# Patient Record
Sex: Female | Born: 1950 | Race: Black or African American | Hispanic: No | Marital: Single | Smoking: Former smoker
Health system: Southern US, Community
[De-identification: ages and names within clinical notes are randomized; demographics above are authoritative.]

## PROBLEM LIST (undated history)

## (undated) DIAGNOSIS — Z5189 Encounter for other specified aftercare: Secondary | ICD-10-CM

## (undated) DIAGNOSIS — D649 Anemia, unspecified: Secondary | ICD-10-CM

## (undated) DIAGNOSIS — C801 Malignant (primary) neoplasm, unspecified: Secondary | ICD-10-CM

## (undated) DIAGNOSIS — M1990 Unspecified osteoarthritis, unspecified site: Secondary | ICD-10-CM

## (undated) DIAGNOSIS — Z8042 Family history of malignant neoplasm of prostate: Secondary | ICD-10-CM

## (undated) DIAGNOSIS — Z8041 Family history of malignant neoplasm of ovary: Secondary | ICD-10-CM

## (undated) DIAGNOSIS — D219 Benign neoplasm of connective and other soft tissue, unspecified: Secondary | ICD-10-CM

## (undated) DIAGNOSIS — E119 Type 2 diabetes mellitus without complications: Secondary | ICD-10-CM

## (undated) DIAGNOSIS — R87629 Unspecified abnormal cytological findings in specimens from vagina: Secondary | ICD-10-CM

## (undated) HISTORY — DX: Type 2 diabetes mellitus without complications: E11.9

## (undated) HISTORY — DX: Malignant (primary) neoplasm, unspecified: C80.1

## (undated) HISTORY — DX: Unspecified osteoarthritis, unspecified site: M19.90

## (undated) HISTORY — DX: Family history of malignant neoplasm of ovary: Z80.41

## (undated) HISTORY — PX: ABDOMINAL HYSTERECTOMY: SHX81

## (undated) HISTORY — DX: Benign neoplasm of connective and other soft tissue, unspecified: D21.9

## (undated) HISTORY — DX: Encounter for other specified aftercare: Z51.89

## (undated) HISTORY — DX: Unspecified abnormal cytological findings in specimens from vagina: R87.629

## (undated) HISTORY — DX: Anemia, unspecified: D64.9

## (undated) HISTORY — DX: Family history of malignant neoplasm of prostate: Z80.42

## (undated) HISTORY — PX: PARTIAL HYSTERECTOMY: SHX80

---

## 2006-12-09 ENCOUNTER — Encounter: Admission: RE | Admit: 2006-12-09 | Discharge: 2006-12-09 | Payer: Self-pay | Admitting: Internal Medicine

## 2007-03-23 ENCOUNTER — Ambulatory Visit (HOSPITAL_COMMUNITY): Admission: RE | Admit: 2007-03-23 | Discharge: 2007-03-23 | Payer: Self-pay | Admitting: Gastroenterology

## 2008-08-23 ENCOUNTER — Encounter: Admission: RE | Admit: 2008-08-23 | Discharge: 2008-08-23 | Payer: Self-pay | Admitting: Internal Medicine

## 2008-08-31 ENCOUNTER — Encounter: Admission: RE | Admit: 2008-08-31 | Discharge: 2008-08-31 | Payer: Self-pay | Admitting: Internal Medicine

## 2010-12-16 NOTE — Op Note (Signed)
NAMECHERESE, LOZANO              ACCOUNT NO.:  [id number]   MEDICAL RECORD NO.:  [id number]          PATIENT TYPE:  AMB   LOCATION:  ENDO                         FACILITY:  [facility]   PHYSICIAN:  Anselmo Rod, M.D.  DATE OF BIRTH:  01/13/1951   DATE OF PROCEDURE:  03/23/2007  DATE OF DISCHARGE:                               OPERATIVE REPORT   PROCEDURE:  Screening colonoscopy.   ENDOSCOPIST:  Anselmo Rod, M.D.   INSTRUMENT USED:  Pentax video colonoscope.   INDICATIONS FOR PROCEDURE:  60 year old African American female  undergoing a screening colonoscopy to rule out colonic polyps, masses,  etc.   PREPROCEDURE PREPARATION:  Informed consent was procured from the  patient.  The patient fasted for 8 hours prior to the procedure and  prepped with a bottle of magnesium citrate and a gallon of NuLytely the  night prior to the procedure.  The risks and benefits of the procedure,  including a 10% missed rate of cancer and polyp, were discussed with the  patient as well.   PREPROCEDURE PHYSICAL:  VITAL SIGNS:  Stable.  NECK:  Supple.  CHEST:  Clear.  HEART:  S1 and S2 regular.  ABDOMEN:  Soft with normal bowel sounds.   DESCRIPTION OF PROCEDURE:  The patient was placed in the left lateral  decubitus position and sedated with 70 mcg of Fentanyl and 6 mg of  Versed given intravenously in slow incremental doses. Once the patient  was adequately sedated and maintained on low-flow oxygen and continuous  cardiac monitoring, the Pentax video colonoscope was advanced from the  rectum to the cecum.  The appendiceal orifice and ileocecal valve were  clearly visualized and photographed.  No masses, polyps, erosions,  ulcerations, or diverticula were seen.  The terminal ileum appeared  healthy and without lesions.  Retroflexion revealed no abnormalities.  The patient tolerated the procedure well without immediate  complications.   IMPRESSION:  Normal colonoscopy to the terminal ileum.   No masses,  polyps, or diverticula noted.   RECOMMENDATIONS:  1. Continue a high fiber diet with liberal fluid intake.  2. Repeat colonoscopy in the next 10 years.  If the patient has any      abdominal symptoms in the interim, the patient is to contact the      office immediately for further recommendations.  3. Outpatient followup as need arises in the future.      Anselmo Rod, M.D.  Electronically Signed     JNM/MEDQ  D:  03/23/2007  T:  03/24/2007  Job:  [id number]   cc:   Gabriel Earing, M.D.  Fax: [phone]

## 2016-11-03 ENCOUNTER — Other Ambulatory Visit: Payer: Self-pay | Admitting: Family Medicine

## 2016-11-03 DIAGNOSIS — Z1231 Encounter for screening mammogram for malignant neoplasm of breast: Secondary | ICD-10-CM

## 2016-11-18 ENCOUNTER — Inpatient Hospital Stay: Admission: RE | Admit: 2016-11-18 | Payer: Self-pay | Source: Ambulatory Visit

## 2017-01-29 ENCOUNTER — Ambulatory Visit
Admission: RE | Admit: 2017-01-29 | Discharge: 2017-01-29 | Disposition: A | Discharge status: Home / Self Care | Payer: PRIVATE HEALTH INSURANCE | Source: Ambulatory Visit | Attending: Family Medicine | Admitting: Family Medicine

## 2017-01-29 ENCOUNTER — Encounter: Payer: Self-pay | Admitting: Radiology

## 2017-01-29 DIAGNOSIS — Z1231 Encounter for screening mammogram for malignant neoplasm of breast: Secondary | ICD-10-CM

## 2018-08-19 DIAGNOSIS — M542 Cervicalgia: Secondary | ICD-10-CM | POA: Diagnosis not present

## 2018-08-19 DIAGNOSIS — M6089 Other myositis, multiple sites: Secondary | ICD-10-CM | POA: Diagnosis not present

## 2018-08-19 DIAGNOSIS — M9902 Segmental and somatic dysfunction of thoracic region: Secondary | ICD-10-CM | POA: Diagnosis not present

## 2018-08-19 DIAGNOSIS — M545 Low back pain: Secondary | ICD-10-CM | POA: Diagnosis not present

## 2018-08-19 DIAGNOSIS — M9903 Segmental and somatic dysfunction of lumbar region: Secondary | ICD-10-CM | POA: Diagnosis not present

## 2018-08-19 DIAGNOSIS — M9901 Segmental and somatic dysfunction of cervical region: Secondary | ICD-10-CM | POA: Diagnosis not present

## 2018-08-26 DIAGNOSIS — M9901 Segmental and somatic dysfunction of cervical region: Secondary | ICD-10-CM | POA: Diagnosis not present

## 2018-08-26 DIAGNOSIS — M6089 Other myositis, multiple sites: Secondary | ICD-10-CM | POA: Diagnosis not present

## 2018-08-26 DIAGNOSIS — M542 Cervicalgia: Secondary | ICD-10-CM | POA: Diagnosis not present

## 2018-08-26 DIAGNOSIS — M545 Low back pain: Secondary | ICD-10-CM | POA: Diagnosis not present

## 2018-08-26 DIAGNOSIS — M9902 Segmental and somatic dysfunction of thoracic region: Secondary | ICD-10-CM | POA: Diagnosis not present

## 2018-08-26 DIAGNOSIS — M9903 Segmental and somatic dysfunction of lumbar region: Secondary | ICD-10-CM | POA: Diagnosis not present

## 2018-09-27 DIAGNOSIS — R0602 Shortness of breath: Secondary | ICD-10-CM | POA: Diagnosis not present

## 2018-09-27 DIAGNOSIS — E789 Disorder of lipoprotein metabolism, unspecified: Secondary | ICD-10-CM | POA: Diagnosis not present

## 2018-09-27 DIAGNOSIS — R5383 Other fatigue: Secondary | ICD-10-CM | POA: Diagnosis not present

## 2018-09-27 DIAGNOSIS — E559 Vitamin D deficiency, unspecified: Secondary | ICD-10-CM | POA: Diagnosis not present

## 2018-09-27 DIAGNOSIS — E119 Type 2 diabetes mellitus without complications: Secondary | ICD-10-CM | POA: Diagnosis not present

## 2018-10-11 DIAGNOSIS — E1165 Type 2 diabetes mellitus with hyperglycemia: Secondary | ICD-10-CM | POA: Diagnosis not present

## 2018-10-11 DIAGNOSIS — M2012 Hallux valgus (acquired), left foot: Secondary | ICD-10-CM | POA: Diagnosis not present

## 2018-10-11 DIAGNOSIS — M2011 Hallux valgus (acquired), right foot: Secondary | ICD-10-CM | POA: Diagnosis not present

## 2018-10-13 DIAGNOSIS — E119 Type 2 diabetes mellitus without complications: Secondary | ICD-10-CM | POA: Diagnosis not present

## 2019-01-24 ENCOUNTER — Other Ambulatory Visit: Payer: Self-pay | Admitting: *Deleted

## 2019-01-24 DIAGNOSIS — Z20822 Contact with and (suspected) exposure to covid-19: Secondary | ICD-10-CM

## 2019-01-27 LAB — NOVEL CORONAVIRUS, NAA: SARS-CoV-2, NAA: NOT DETECTED

## 2019-01-31 ENCOUNTER — Telehealth: Payer: Self-pay | Admitting: Family Medicine

## 2019-01-31 NOTE — Telephone Encounter (Signed)
Pt called and gave her Neg Covid results, pt expressed understanding

## 2019-03-28 DIAGNOSIS — R5383 Other fatigue: Secondary | ICD-10-CM | POA: Diagnosis not present

## 2019-03-28 DIAGNOSIS — Z0000 Encounter for general adult medical examination without abnormal findings: Secondary | ICD-10-CM | POA: Diagnosis not present

## 2019-03-28 DIAGNOSIS — Z1159 Encounter for screening for other viral diseases: Secondary | ICD-10-CM | POA: Diagnosis not present

## 2019-03-28 DIAGNOSIS — E119 Type 2 diabetes mellitus without complications: Secondary | ICD-10-CM | POA: Diagnosis not present

## 2019-03-28 DIAGNOSIS — E559 Vitamin D deficiency, unspecified: Secondary | ICD-10-CM | POA: Diagnosis not present

## 2019-03-28 DIAGNOSIS — E789 Disorder of lipoprotein metabolism, unspecified: Secondary | ICD-10-CM | POA: Diagnosis not present

## 2019-03-28 DIAGNOSIS — R0602 Shortness of breath: Secondary | ICD-10-CM | POA: Diagnosis not present

## 2019-03-30 ENCOUNTER — Other Ambulatory Visit: Payer: Self-pay | Admitting: Family Medicine

## 2019-03-30 DIAGNOSIS — Z1231 Encounter for screening mammogram for malignant neoplasm of breast: Secondary | ICD-10-CM

## 2019-04-04 DIAGNOSIS — E559 Vitamin D deficiency, unspecified: Secondary | ICD-10-CM | POA: Diagnosis not present

## 2019-04-04 DIAGNOSIS — E119 Type 2 diabetes mellitus without complications: Secondary | ICD-10-CM | POA: Diagnosis not present

## 2019-04-04 DIAGNOSIS — R5383 Other fatigue: Secondary | ICD-10-CM | POA: Diagnosis not present

## 2019-04-05 DIAGNOSIS — R0602 Shortness of breath: Secondary | ICD-10-CM | POA: Diagnosis not present

## 2019-04-05 DIAGNOSIS — I493 Ventricular premature depolarization: Secondary | ICD-10-CM | POA: Diagnosis not present

## 2019-04-06 DIAGNOSIS — Z780 Asymptomatic menopausal state: Secondary | ICD-10-CM | POA: Diagnosis not present

## 2019-04-13 DIAGNOSIS — E119 Type 2 diabetes mellitus without complications: Secondary | ICD-10-CM | POA: Diagnosis not present

## 2019-04-13 DIAGNOSIS — Z780 Asymptomatic menopausal state: Secondary | ICD-10-CM | POA: Diagnosis not present

## 2019-04-13 DIAGNOSIS — Z1339 Encounter for screening examination for other mental health and behavioral disorders: Secondary | ICD-10-CM | POA: Diagnosis not present

## 2019-04-13 DIAGNOSIS — E559 Vitamin D deficiency, unspecified: Secondary | ICD-10-CM | POA: Diagnosis not present

## 2019-04-13 DIAGNOSIS — E789 Disorder of lipoprotein metabolism, unspecified: Secondary | ICD-10-CM | POA: Diagnosis not present

## 2019-04-13 DIAGNOSIS — Z1331 Encounter for screening for depression: Secondary | ICD-10-CM | POA: Diagnosis not present

## 2019-05-25 ENCOUNTER — Other Ambulatory Visit: Payer: Self-pay

## 2019-05-25 ENCOUNTER — Ambulatory Visit
Admission: RE | Admit: 2019-05-25 | Discharge: 2019-05-25 | Disposition: A | Discharge status: Home / Self Care | Payer: PPO | Source: Ambulatory Visit | Attending: Family Medicine | Admitting: Family Medicine

## 2019-05-25 DIAGNOSIS — Z1231 Encounter for screening mammogram for malignant neoplasm of breast: Secondary | ICD-10-CM

## 2019-06-07 DIAGNOSIS — Z20828 Contact with and (suspected) exposure to other viral communicable diseases: Secondary | ICD-10-CM | POA: Diagnosis not present

## 2019-06-28 DIAGNOSIS — E119 Type 2 diabetes mellitus without complications: Secondary | ICD-10-CM | POA: Diagnosis not present

## 2019-06-28 DIAGNOSIS — H40033 Anatomical narrow angle, bilateral: Secondary | ICD-10-CM | POA: Diagnosis not present

## 2019-07-18 DIAGNOSIS — I493 Ventricular premature depolarization: Secondary | ICD-10-CM | POA: Diagnosis not present

## 2019-07-18 DIAGNOSIS — E559 Vitamin D deficiency, unspecified: Secondary | ICD-10-CM | POA: Diagnosis not present

## 2019-07-18 DIAGNOSIS — E119 Type 2 diabetes mellitus without complications: Secondary | ICD-10-CM | POA: Diagnosis not present

## 2019-07-18 DIAGNOSIS — Z1159 Encounter for screening for other viral diseases: Secondary | ICD-10-CM | POA: Diagnosis not present

## 2019-07-18 DIAGNOSIS — E789 Disorder of lipoprotein metabolism, unspecified: Secondary | ICD-10-CM | POA: Diagnosis not present

## 2019-07-18 DIAGNOSIS — Z03818 Encounter for observation for suspected exposure to other biological agents ruled out: Secondary | ICD-10-CM | POA: Diagnosis not present

## 2019-09-29 ENCOUNTER — Ambulatory Visit: Payer: PPO | Attending: Internal Medicine

## 2019-09-29 DIAGNOSIS — Z23 Encounter for immunization: Secondary | ICD-10-CM

## 2019-09-29 NOTE — Progress Notes (Signed)
   Covid-19 Vaccination Clinic  Name:  Nyasha Woolf    MRN: [id number] DOB: 05-09-1951  09/29/2019  Ms. Ochoa was observed post Covid-19 immunization for 15 minutes without incidence. She was provided with Vaccine Information Sheet and instruction to access the V-Safe system.   Ms. Zoda was instructed to call 911 with any severe reactions post vaccine: Marland Kitchen Difficulty breathing  . Swelling of your face and throat  . A fast heartbeat  . A bad rash all over your body  . Dizziness and weakness    Immunizations Administered    Name Date Dose VIS Date Route   Pfizer COVID-19 Vaccine 09/29/2019  2:21 PM 0.3 mL 07/14/2019 Intramuscular   Manufacturer: [place]   Lot: [id number]   [place]: [id number]

## 2019-10-25 ENCOUNTER — Ambulatory Visit: Payer: PPO | Attending: Internal Medicine

## 2019-10-25 DIAGNOSIS — Z23 Encounter for immunization: Secondary | ICD-10-CM

## 2019-10-25 NOTE — Progress Notes (Signed)
   Covid-19 Vaccination Clinic  Name:  Christy Wiley    MRN: [id number] DOB: 11-04-1950  10/25/2019  Christy Wiley was observed post Covid-19 immunization for 15 minutes without incident. She was provided with Vaccine Information Sheet and instruction to access the V-Safe system.   Christy Wiley was instructed to call 911 with any severe reactions post vaccine: Marland Kitchen Difficulty breathing  . Swelling of face and throat  . A fast heartbeat  . A bad rash all over body  . Dizziness and weakness   Immunizations Administered    Name Date Dose VIS Date Route   Pfizer COVID-19 Vaccine 10/25/2019  3:08 PM 0.3 mL 07/14/2019 Intramuscular   Manufacturer: [place]   Lot: [id number]   [place]: [id number]

## 2019-12-28 DIAGNOSIS — E789 Disorder of lipoprotein metabolism, unspecified: Secondary | ICD-10-CM | POA: Diagnosis not present

## 2019-12-28 DIAGNOSIS — E559 Vitamin D deficiency, unspecified: Secondary | ICD-10-CM | POA: Diagnosis not present

## 2019-12-28 DIAGNOSIS — Z1159 Encounter for screening for other viral diseases: Secondary | ICD-10-CM | POA: Diagnosis not present

## 2019-12-28 DIAGNOSIS — E119 Type 2 diabetes mellitus without complications: Secondary | ICD-10-CM | POA: Diagnosis not present

## 2020-04-04 DIAGNOSIS — E119 Type 2 diabetes mellitus without complications: Secondary | ICD-10-CM | POA: Diagnosis not present

## 2020-04-04 DIAGNOSIS — Z1331 Encounter for screening for depression: Secondary | ICD-10-CM | POA: Diagnosis not present

## 2020-04-04 DIAGNOSIS — Z1159 Encounter for screening for other viral diseases: Secondary | ICD-10-CM | POA: Diagnosis not present

## 2020-04-04 DIAGNOSIS — E1165 Type 2 diabetes mellitus with hyperglycemia: Secondary | ICD-10-CM | POA: Diagnosis not present

## 2020-04-04 DIAGNOSIS — E559 Vitamin D deficiency, unspecified: Secondary | ICD-10-CM | POA: Diagnosis not present

## 2020-04-04 DIAGNOSIS — E789 Disorder of lipoprotein metabolism, unspecified: Secondary | ICD-10-CM | POA: Diagnosis not present

## 2020-04-04 DIAGNOSIS — R0602 Shortness of breath: Secondary | ICD-10-CM | POA: Diagnosis not present

## 2020-04-04 DIAGNOSIS — Z0000 Encounter for general adult medical examination without abnormal findings: Secondary | ICD-10-CM | POA: Diagnosis not present

## 2020-04-04 DIAGNOSIS — Z20822 Contact with and (suspected) exposure to covid-19: Secondary | ICD-10-CM | POA: Diagnosis not present

## 2020-04-04 DIAGNOSIS — Z1339 Encounter for screening examination for other mental health and behavioral disorders: Secondary | ICD-10-CM | POA: Diagnosis not present

## 2020-04-04 DIAGNOSIS — R5383 Other fatigue: Secondary | ICD-10-CM | POA: Diagnosis not present

## 2020-04-12 DIAGNOSIS — R5383 Other fatigue: Secondary | ICD-10-CM | POA: Diagnosis not present

## 2020-04-12 DIAGNOSIS — M2012 Hallux valgus (acquired), left foot: Secondary | ICD-10-CM | POA: Diagnosis not present

## 2020-04-12 DIAGNOSIS — Z87891 Personal history of nicotine dependence: Secondary | ICD-10-CM | POA: Diagnosis not present

## 2020-04-12 DIAGNOSIS — M2011 Hallux valgus (acquired), right foot: Secondary | ICD-10-CM | POA: Diagnosis not present

## 2020-04-12 DIAGNOSIS — Z1159 Encounter for screening for other viral diseases: Secondary | ICD-10-CM | POA: Diagnosis not present

## 2020-04-12 DIAGNOSIS — E1165 Type 2 diabetes mellitus with hyperglycemia: Secondary | ICD-10-CM | POA: Diagnosis not present

## 2020-04-12 DIAGNOSIS — E559 Vitamin D deficiency, unspecified: Secondary | ICD-10-CM | POA: Diagnosis not present

## 2020-04-16 DIAGNOSIS — E789 Disorder of lipoprotein metabolism, unspecified: Secondary | ICD-10-CM | POA: Diagnosis not present

## 2020-04-16 DIAGNOSIS — E559 Vitamin D deficiency, unspecified: Secondary | ICD-10-CM | POA: Diagnosis not present

## 2020-04-16 DIAGNOSIS — Z1211 Encounter for screening for malignant neoplasm of colon: Secondary | ICD-10-CM | POA: Diagnosis not present

## 2020-04-16 DIAGNOSIS — E119 Type 2 diabetes mellitus without complications: Secondary | ICD-10-CM | POA: Diagnosis not present

## 2020-04-18 DIAGNOSIS — L821 Other seborrheic keratosis: Secondary | ICD-10-CM | POA: Diagnosis not present

## 2020-04-18 DIAGNOSIS — D2371 Other benign neoplasm of skin of right lower limb, including hip: Secondary | ICD-10-CM | POA: Diagnosis not present

## 2020-04-18 DIAGNOSIS — L853 Xerosis cutis: Secondary | ICD-10-CM | POA: Diagnosis not present

## 2020-04-24 DIAGNOSIS — Z1211 Encounter for screening for malignant neoplasm of colon: Secondary | ICD-10-CM | POA: Diagnosis not present

## 2020-04-24 DIAGNOSIS — Z01818 Encounter for other preprocedural examination: Secondary | ICD-10-CM | POA: Diagnosis not present

## 2020-06-06 DIAGNOSIS — E119 Type 2 diabetes mellitus without complications: Secondary | ICD-10-CM | POA: Diagnosis not present

## 2020-06-06 DIAGNOSIS — H40033 Anatomical narrow angle, bilateral: Secondary | ICD-10-CM | POA: Diagnosis not present

## 2020-06-12 DIAGNOSIS — Z1211 Encounter for screening for malignant neoplasm of colon: Secondary | ICD-10-CM | POA: Diagnosis not present

## 2020-06-13 DIAGNOSIS — E119 Type 2 diabetes mellitus without complications: Secondary | ICD-10-CM | POA: Diagnosis not present

## 2020-06-13 DIAGNOSIS — Z01818 Encounter for other preprocedural examination: Secondary | ICD-10-CM | POA: Diagnosis not present

## 2020-06-13 DIAGNOSIS — Z1211 Encounter for screening for malignant neoplasm of colon: Secondary | ICD-10-CM | POA: Diagnosis not present

## 2020-06-18 DIAGNOSIS — K635 Polyp of colon: Secondary | ICD-10-CM | POA: Diagnosis not present

## 2020-07-03 DIAGNOSIS — K648 Other hemorrhoids: Secondary | ICD-10-CM | POA: Diagnosis not present

## 2020-07-03 DIAGNOSIS — D369 Benign neoplasm, unspecified site: Secondary | ICD-10-CM | POA: Diagnosis not present

## 2020-07-22 DIAGNOSIS — M7711 Lateral epicondylitis, right elbow: Secondary | ICD-10-CM | POA: Diagnosis not present

## 2020-07-22 DIAGNOSIS — M25521 Pain in right elbow: Secondary | ICD-10-CM | POA: Diagnosis not present

## 2020-08-01 DIAGNOSIS — Z20822 Contact with and (suspected) exposure to covid-19: Secondary | ICD-10-CM | POA: Diagnosis not present

## 2020-08-01 DIAGNOSIS — N183 Chronic kidney disease, stage 3 unspecified: Secondary | ICD-10-CM | POA: Diagnosis not present

## 2020-08-01 DIAGNOSIS — E789 Disorder of lipoprotein metabolism, unspecified: Secondary | ICD-10-CM | POA: Diagnosis not present

## 2020-08-01 DIAGNOSIS — E559 Vitamin D deficiency, unspecified: Secondary | ICD-10-CM | POA: Diagnosis not present

## 2020-08-01 DIAGNOSIS — M7711 Lateral epicondylitis, right elbow: Secondary | ICD-10-CM | POA: Diagnosis not present

## 2020-08-01 DIAGNOSIS — E119 Type 2 diabetes mellitus without complications: Secondary | ICD-10-CM | POA: Diagnosis not present

## 2020-08-01 DIAGNOSIS — M25521 Pain in right elbow: Secondary | ICD-10-CM | POA: Diagnosis not present

## 2021-08-21 IMAGING — MG DIGITAL SCREENING BILAT W/ TOMO W/ CAD
8 series · 9 of 24 positions shown · non-contrast
Comparison: Previous exam(s).

CLINICAL DATA: Screening.

EXAM:
DIGITAL SCREENING BILATERAL MAMMOGRAM WITH TOMO AND CAD

[R CC synth-2D]
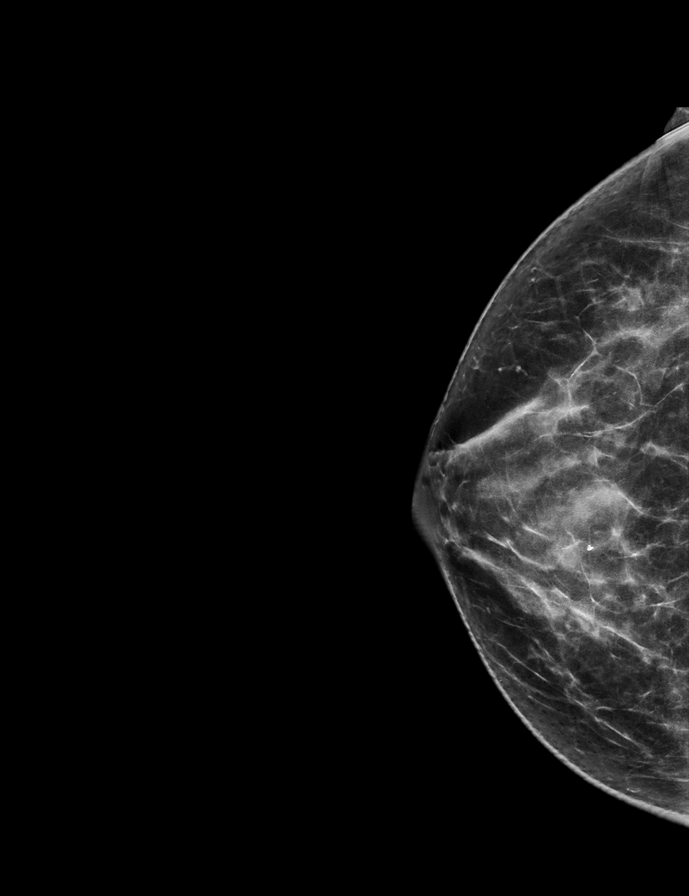

[L CC synth-2D]
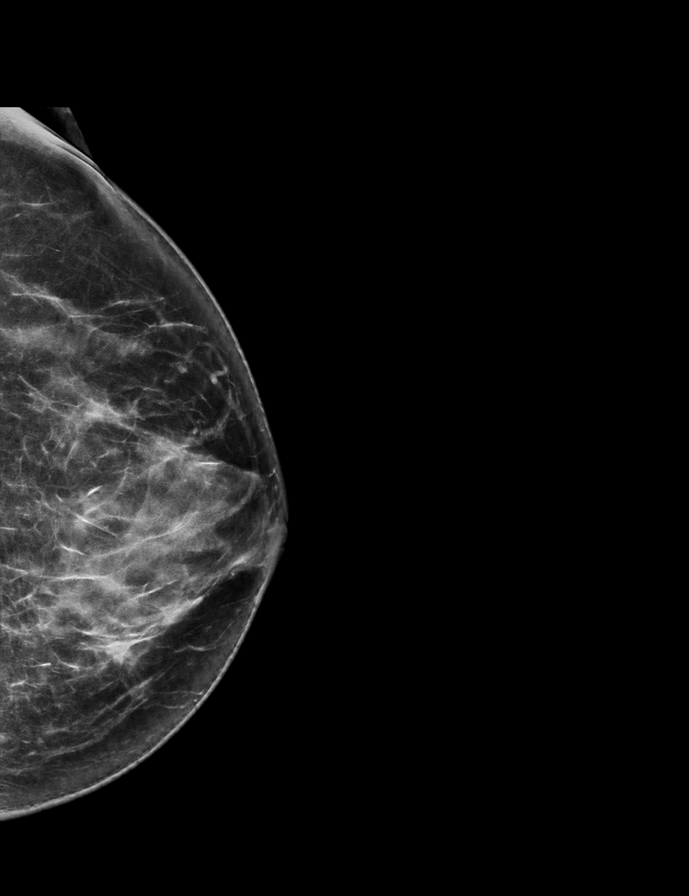

[R MLO synth-2D]
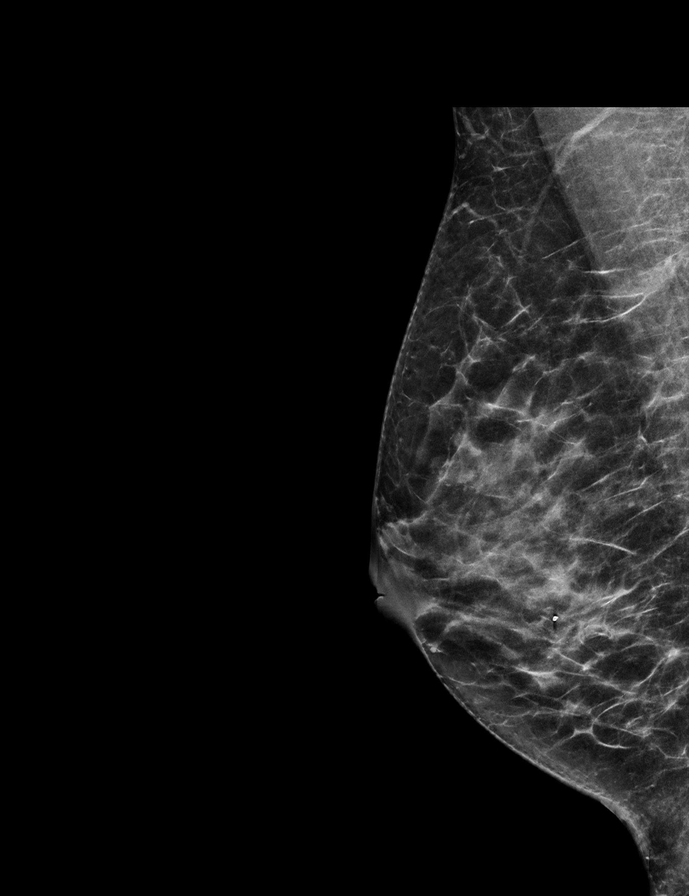

[L MLO synth-2D]
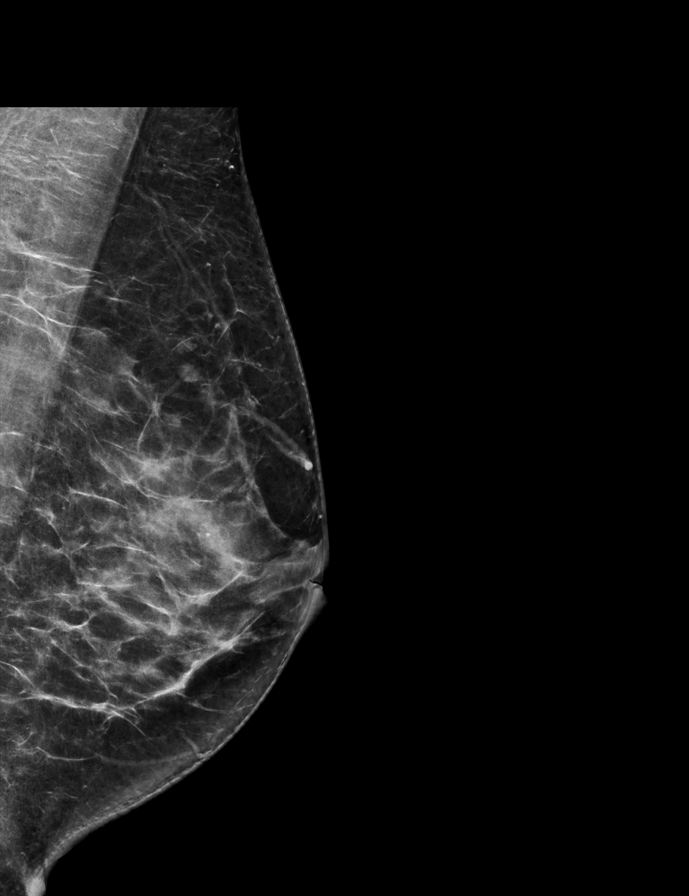

[L CC tomo · 2 of 70 frames shown]
[frame 23/70]
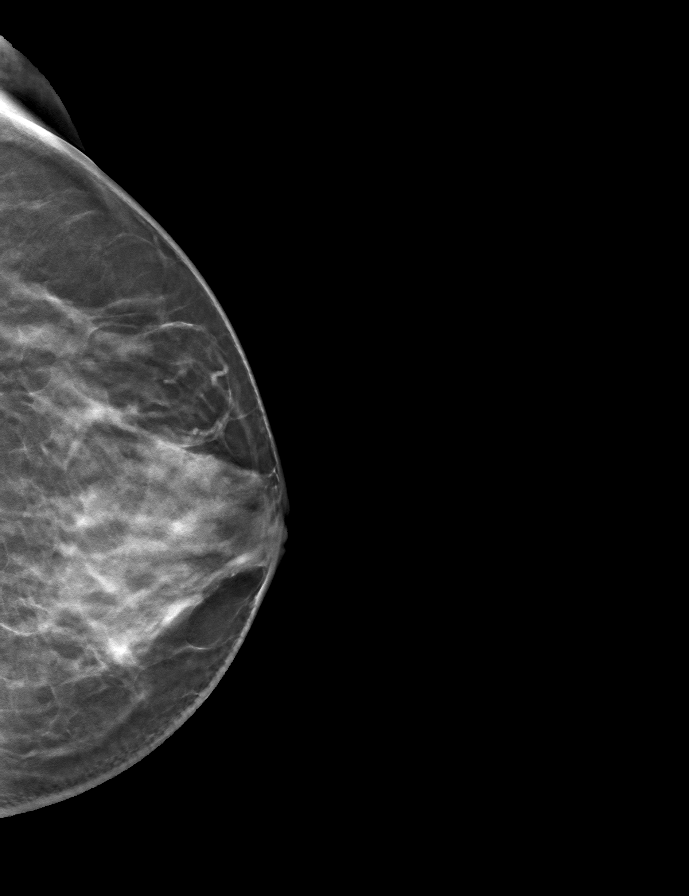
[frame 35/70]
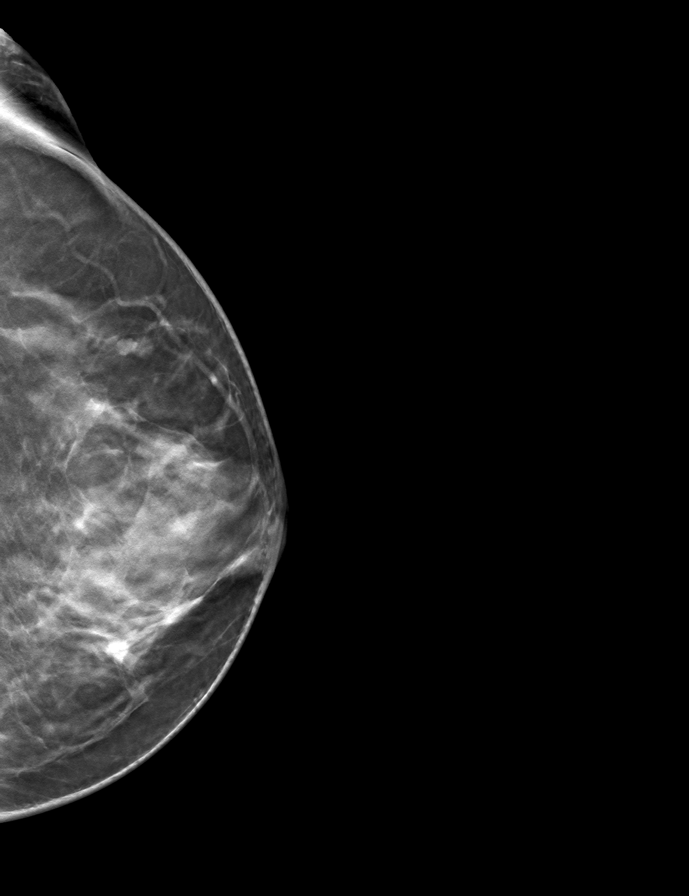

[R MLO tomo · tomo slice 31/62.0]
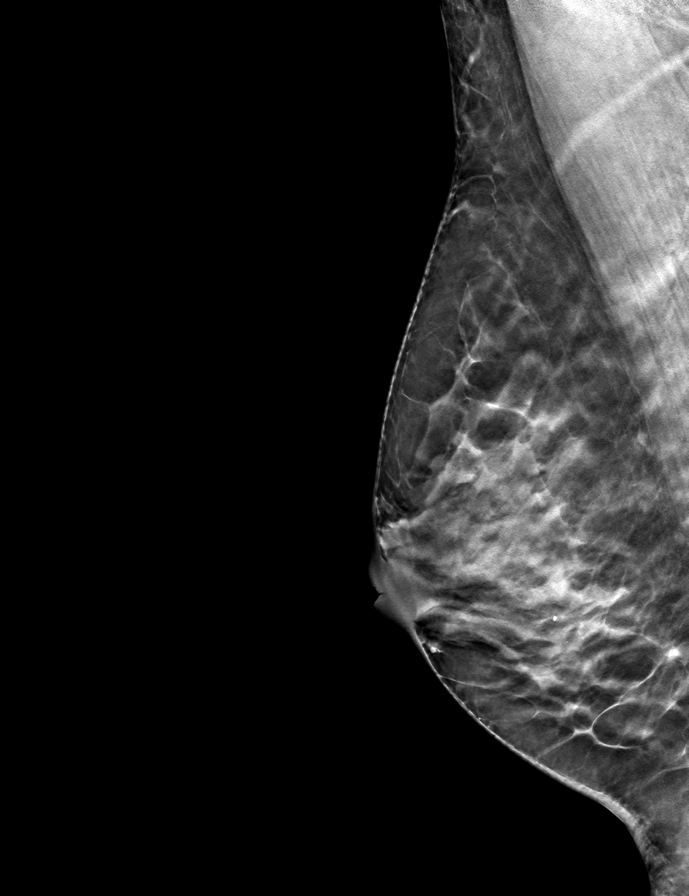

[R CC tomo · tomo slice 33/64.0]
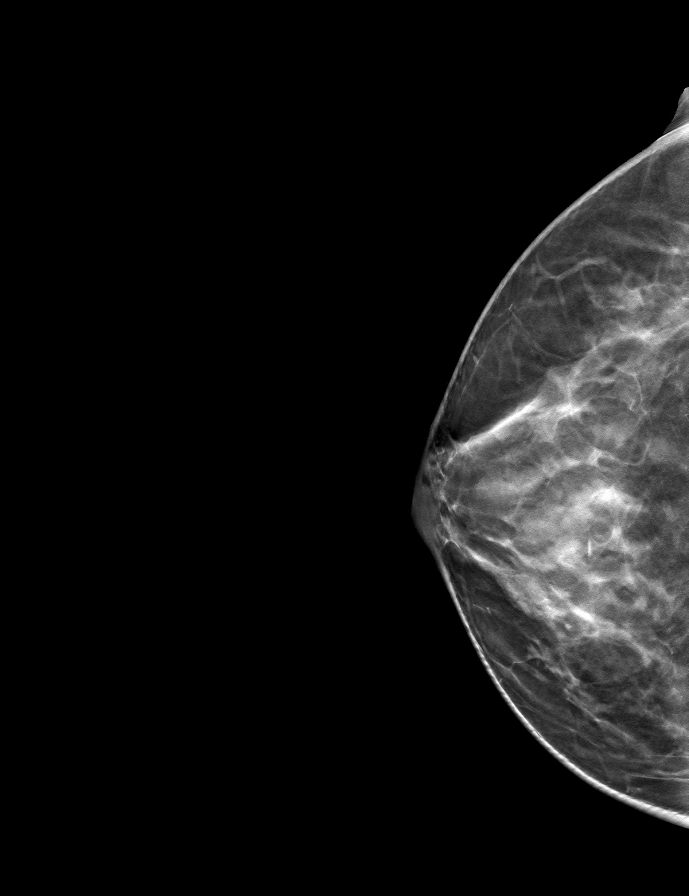

[L MLO tomo · tomo slice 35/70.0]
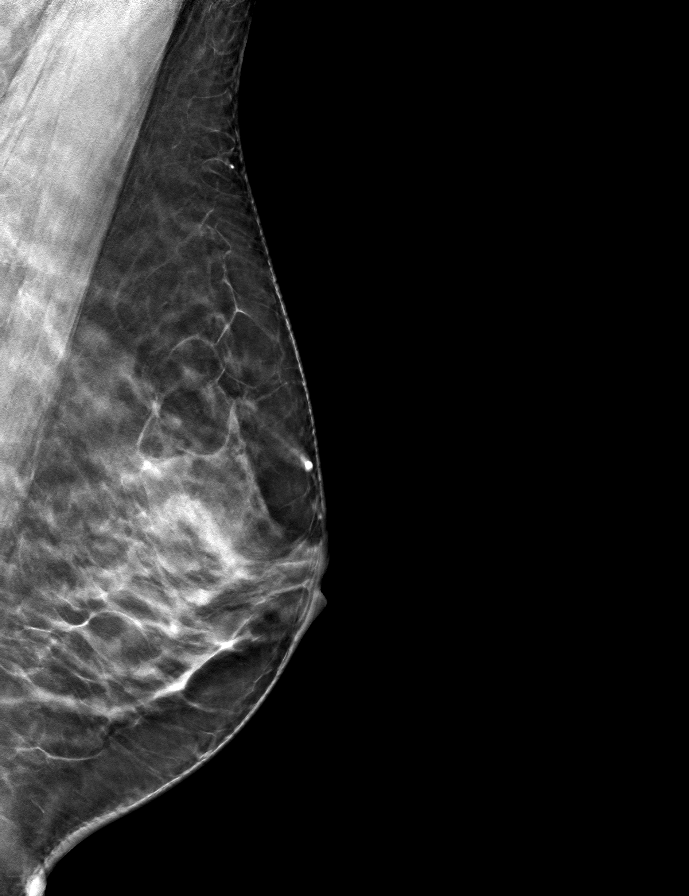

[9 of 24 positions shown; findings below may reference images not displayed]

ACR Breast Density Category c: The breast tissue is heterogeneously
dense, which may obscure small masses.
FINDINGS: There are no findings suspicious for malignancy. Images were
processed with CAD.
IMPRESSION: No mammographic evidence of malignancy. A result letter of this
screening mammogram will be mailed directly to the patient.

RECOMMENDATION:
Screening mammogram in one year. (Code:[id number])

BI-RADS CATEGORY  1: Negative.

## 2021-09-30 ENCOUNTER — Other Ambulatory Visit: Payer: Self-pay | Admitting: Family Medicine

## 2021-09-30 DIAGNOSIS — Z1231 Encounter for screening mammogram for malignant neoplasm of breast: Secondary | ICD-10-CM

## 2022-10-08 ENCOUNTER — Other Ambulatory Visit: Payer: Self-pay | Admitting: Family Medicine

## 2022-10-08 DIAGNOSIS — Z1231 Encounter for screening mammogram for malignant neoplasm of breast: Secondary | ICD-10-CM

## 2022-11-27 ENCOUNTER — Ambulatory Visit
Admission: RE | Admit: 2022-11-27 | Discharge: 2022-11-27 | Disposition: A | Discharge status: Home / Self Care | Payer: PPO | Source: Ambulatory Visit | Attending: Family Medicine | Admitting: Family Medicine

## 2022-11-27 DIAGNOSIS — Z1231 Encounter for screening mammogram for malignant neoplasm of breast: Secondary | ICD-10-CM

## 2024-02-17 ENCOUNTER — Other Ambulatory Visit: Payer: Self-pay | Admitting: Family Medicine

## 2024-02-17 DIAGNOSIS — Z1231 Encounter for screening mammogram for malignant neoplasm of breast: Secondary | ICD-10-CM

## 2024-02-18 ENCOUNTER — Ambulatory Visit
Admission: RE | Admit: 2024-02-18 | Discharge: 2024-02-18 | Disposition: A | Discharge status: Home / Self Care | Source: Ambulatory Visit | Attending: Family Medicine | Admitting: Family Medicine

## 2024-02-18 DIAGNOSIS — Z1231 Encounter for screening mammogram for malignant neoplasm of breast: Secondary | ICD-10-CM

## 2024-02-24 ENCOUNTER — Other Ambulatory Visit: Payer: Self-pay | Admitting: Family Medicine

## 2024-02-24 DIAGNOSIS — R928 Other abnormal and inconclusive findings on diagnostic imaging of breast: Secondary | ICD-10-CM

## 2024-03-07 ENCOUNTER — Other Ambulatory Visit: Payer: Self-pay | Admitting: Family Medicine

## 2024-03-07 ENCOUNTER — Ambulatory Visit
Admission: RE | Admit: 2024-03-07 | Discharge: 2024-03-07 | Disposition: A | Discharge status: Home / Self Care | Source: Ambulatory Visit | Attending: Family Medicine | Admitting: Family Medicine

## 2024-03-07 DIAGNOSIS — R599 Enlarged lymph nodes, unspecified: Secondary | ICD-10-CM

## 2024-03-07 DIAGNOSIS — R928 Other abnormal and inconclusive findings on diagnostic imaging of breast: Secondary | ICD-10-CM

## 2024-03-07 DIAGNOSIS — N6310 Unspecified lump in the right breast, unspecified quadrant: Secondary | ICD-10-CM

## 2024-03-28 ENCOUNTER — Ambulatory Visit
Admission: RE | Admit: 2024-03-28 | Discharge: 2024-03-28 | Disposition: A | Discharge status: Home / Self Care | Source: Ambulatory Visit | Attending: Family Medicine | Admitting: Family Medicine

## 2024-03-28 DIAGNOSIS — R599 Enlarged lymph nodes, unspecified: Secondary | ICD-10-CM

## 2024-03-28 DIAGNOSIS — N6310 Unspecified lump in the right breast, unspecified quadrant: Secondary | ICD-10-CM

## 2024-03-28 HISTORY — PX: BREAST BIOPSY: SHX20

## 2024-03-29 LAB — SURGICAL PATHOLOGY

## 2024-04-04 ENCOUNTER — Encounter: Payer: Self-pay | Admitting: *Deleted

## 2024-04-04 DIAGNOSIS — C50811 Malignant neoplasm of overlapping sites of right female breast: Secondary | ICD-10-CM | POA: Insufficient documentation

## 2024-04-05 ENCOUNTER — Other Ambulatory Visit: Payer: Self-pay

## 2024-04-05 ENCOUNTER — Encounter: Payer: Self-pay | Admitting: General Practice

## 2024-04-05 ENCOUNTER — Ambulatory Visit: Attending: General Surgery | Admitting: Physical Therapy

## 2024-04-05 ENCOUNTER — Encounter: Payer: Self-pay | Admitting: *Deleted

## 2024-04-05 ENCOUNTER — Encounter: Payer: Self-pay | Admitting: Physical Therapy

## 2024-04-05 ENCOUNTER — Ambulatory Visit: Payer: Self-pay | Admitting: General Surgery

## 2024-04-05 ENCOUNTER — Inpatient Hospital Stay: Attending: Hematology and Oncology

## 2024-04-05 ENCOUNTER — Inpatient Hospital Stay: Admitting: Genetic Counselor

## 2024-04-05 ENCOUNTER — Ambulatory Visit
Admission: RE | Admit: 2024-04-05 | Discharge: 2024-04-05 | Disposition: A | Discharge status: Home / Self Care | Source: Ambulatory Visit | Attending: Radiation Oncology | Admitting: Radiation Oncology

## 2024-04-05 ENCOUNTER — Inpatient Hospital Stay: Admitting: Hematology and Oncology

## 2024-04-05 VITALS — BP 145/70 | HR 67 | Temp 97.3°F | Resp 16 | Wt 145.5 lb

## 2024-04-05 DIAGNOSIS — Z171 Estrogen receptor negative status [ER-]: Secondary | ICD-10-CM | POA: Insufficient documentation

## 2024-04-05 DIAGNOSIS — Z5111 Encounter for antineoplastic chemotherapy: Secondary | ICD-10-CM | POA: Diagnosis present

## 2024-04-05 DIAGNOSIS — C50811 Malignant neoplasm of overlapping sites of right female breast: Secondary | ICD-10-CM

## 2024-04-05 DIAGNOSIS — R293 Abnormal posture: Secondary | ICD-10-CM | POA: Diagnosis present

## 2024-04-05 DIAGNOSIS — D0511 Intraductal carcinoma in situ of right breast: Secondary | ICD-10-CM | POA: Insufficient documentation

## 2024-04-05 DIAGNOSIS — M25512 Pain in left shoulder: Secondary | ICD-10-CM | POA: Insufficient documentation

## 2024-04-05 DIAGNOSIS — Z79899 Other long term (current) drug therapy: Secondary | ICD-10-CM | POA: Insufficient documentation

## 2024-04-05 DIAGNOSIS — C50411 Malignant neoplasm of upper-outer quadrant of right female breast: Secondary | ICD-10-CM | POA: Insufficient documentation

## 2024-04-05 DIAGNOSIS — Z8042 Family history of malignant neoplasm of prostate: Secondary | ICD-10-CM

## 2024-04-05 DIAGNOSIS — E1165 Type 2 diabetes mellitus with hyperglycemia: Secondary | ICD-10-CM | POA: Diagnosis not present

## 2024-04-05 DIAGNOSIS — Z8041 Family history of malignant neoplasm of ovary: Secondary | ICD-10-CM

## 2024-04-05 DIAGNOSIS — C773 Secondary and unspecified malignant neoplasm of axilla and upper limb lymph nodes: Secondary | ICD-10-CM | POA: Insufficient documentation

## 2024-04-05 LAB — CBC WITH DIFFERENTIAL (CANCER CENTER ONLY)
Abs Immature Granulocytes: 0.01 K/uL (ref 0.00–0.07)
Basophils Absolute: 0 K/uL (ref 0.0–0.1)
Basophils Relative: 1 %
Eosinophils Absolute: 0.1 K/uL (ref 0.0–0.5)
Eosinophils Relative: 2 %
HCT: 41.3 % (ref 36.0–46.0)
Hemoglobin: 13.7 g/dL (ref 12.0–15.0)
Immature Granulocytes: 0 %
Lymphocytes Relative: 38 %
Lymphs Abs: 2 K/uL (ref 0.7–4.0)
MCH: 28.5 pg (ref 26.0–34.0)
MCHC: 33.2 g/dL (ref 30.0–36.0)
MCV: 86 fL (ref 80.0–100.0)
Monocytes Absolute: 0.4 K/uL (ref 0.1–1.0)
Monocytes Relative: 9 %
Neutro Abs: 2.6 K/uL (ref 1.7–7.7)
Neutrophils Relative %: 50 %
Platelet Count: 291 K/uL (ref 150–400)
RBC: 4.8 MIL/uL (ref 3.87–5.11)
RDW: 12.8 % (ref 11.5–15.5)
WBC Count: 5.2 K/uL (ref 4.0–10.5)
nRBC: 0 % (ref 0.0–0.2)

## 2024-04-05 LAB — CMP (CANCER CENTER ONLY)
ALT: 12 U/L (ref 0–44)
AST: 13 U/L — ABNORMAL LOW (ref 15–41)
Albumin: 4.2 g/dL (ref 3.5–5.0)
Alkaline Phosphatase: 57 U/L (ref 38–126)
Anion gap: 4 — ABNORMAL LOW (ref 5–15)
BUN: 14 mg/dL (ref 8–23)
CO2: 27 mmol/L (ref 22–32)
Calcium: 9.5 mg/dL (ref 8.9–10.3)
Chloride: 108 mmol/L (ref 98–111)
Creatinine: 0.68 mg/dL (ref 0.44–1.00)
GFR, Estimated: >60 mL/min (ref >60)
Glucose, Bld: 120 mg/dL — ABNORMAL HIGH (ref 70–99)
Potassium: 4.5 mmol/L (ref 3.5–5.1)
Sodium: 139 mmol/L (ref 135–145)
Total Bilirubin: 0.5 mg/dL (ref 0.0–1.2)
Total Protein: 7.6 g/dL (ref 6.5–8.1)

## 2024-04-05 LAB — GENETIC SCREENING ORDER

## 2024-04-05 MED ORDER — ONDANSETRON HCL 8 MG PO TABS: 8.0000 mg | ORAL_TABLET | Freq: Three times a day (TID) | ORAL | 1 refills | Status: AC | PRN

## 2024-04-05 MED ORDER — LIDOCAINE-PRILOCAINE 2.5-2.5 % EX CREA: TOPICAL_CREAM | CUTANEOUS | 3 refills | Status: AC

## 2024-04-05 MED ORDER — PROCHLORPERAZINE MALEATE 10 MG PO TABS: 10.0000 mg | ORAL_TABLET | Freq: Four times a day (QID) | ORAL | 1 refills | Status: AC | PRN

## 2024-04-05 NOTE — Progress Notes (Signed)
 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  A routine mammogram revealed two areas of abnormality, leading to a biopsy that confirmed invasive ductal carcinoma. A small lymph node within the breast and a lymph node under the arm were biopsied and found positive for cancer. An ultrasound identified two tumors measuring 1.9 cm and 0.7 cm. A subpectoral lymph node was noted but not biopsied.  The cancer is triple negative, lacking estrogen, progesterone, and HER2 receptors. The KI-67 proliferation index is 90%, indicating a fast-growing tumor, and the cancer is graded as a grade 3.  She has diabetes with a recent A1c of 8. She takes meloxicam for persistent leg pain since July of the previous year, with some relief. An ultrasound and x-ray were performed, but the pain persists, with a 'broken vein' appearance and bruising.  She is active in her community and church but has not attended her fitness center recently. She lives with her legally blind brother and has no other family nearby. She is retired from [employer]. She is concerned about the possibility of cancer spread due to its aggressive nature.     I reviewed her records extensively and collaborated the history with the patient.  SUMMARY OF ONCOLOGIC HISTORY: Oncology History  Malignant neoplasm of overlapping sites of right breast in female, estrogen receptor negative (HCC)  03/28/2024 Initial Diagnosis   Screening mammogram detected right breast asymmetry, 2 lesions 1.9 cm at 10:00 and 0.7 cm at the 11:00: Biopsy: Grade 3 IDC with high-grade DCIS triple negative Ki-67  90%   04/05/2024 Cancer Staging   Staging form: Breast, AJCC 8th Edition - Clinical: Stage IIB (cT1c, cN1, cM0, G3, ER-, PR-, HER2-) - Signed by Odean Potts, MD on 04/05/2024 Stage prefix: Initial diagnosis Histologic grading system: 3 grade system      MEDICAL HISTORY:  No past medical history on file.  SURGICAL HISTORY: Past Surgical History:  Procedure Laterality Date   BREAST BIOPSY Right 03/28/2024   US  RT BREAST BX W LOC DEV 1ST LESION IMG BX SPEC US  GUIDE 03/28/2024 [facility]   BREAST BIOPSY Right 03/28/2024   US  RT BREAST BX W LOC DEV EA ADD LESION IMG BX SPEC US  GUIDE 03/28/2024 [facility]    SOCIAL HISTORY: Social History   Socioeconomic History   Marital status: Divorced    Spouse name: Not on file   Number of children: Not on file   Years of education: Not on file   Highest education level: Not on file  Occupational History   Not on file  Tobacco Use   Smoking status: Not on file   Smokeless tobacco: Not on file  Substance and Sexual Activity   Alcohol  use: Not on file   Drug use: Not on file   Sexual activity: Not on file  Other Topics Concern   Not on file  Social History Narrative   Not on file   Social Drivers of Health   Financial Resource Strain: Not on file  Food Insecurity: Not on file  Transportation Needs: Not on file  Physical Activity: Not on file  Stress: Not on file  Social Connections: Not on file  Intimate  Partner Violence: Not on file    FAMILY HISTORY: Family History  Problem Relation Age of Onset   Breast cancer Neg Hx     ALLERGIES:  is allergic to penicillin g.  MEDICATIONS:  Current Outpatient Medications  Medication Sig Dispense Refill   atorvastatin (LIPITOR) 40 MG tablet Take 40 mg by mouth.     dapagliflozin propanediol (FARXIGA) 5 MG TABS tablet Take 5 mg by mouth.     lisinopril (ZESTRIL) 5 MG tablet Take 5 mg by mouth.     No current facility-administered medications for this visit.     REVIEW OF SYSTEMS:   Constitutional: Denies fevers, chills or abnormal night sweats Breast:  Denies any palpable lumps or discharge All other systems were reviewed with the patient and are negative.  PHYSICAL EXAMINATION: ECOG PERFORMANCE STATUS: 0 - Asymptomatic  Vitals:   04/05/24 1316  BP: (!) 145/70  Pulse: 67  Resp: 16  Temp: (!) 97.3 F (36.3 C)  SpO2: 98%   Filed Weights   04/05/24 1316  Weight: 145 lb 8 oz (66 kg)    GENERAL:alert, no distress and comfortable    LABORATORY DATA:  I have reviewed the data as listed Lab Results  Component Value Date   WBC 5.2 04/05/2024   HGB 13.7 04/05/2024   HCT 41.3 04/05/2024   MCV 86.0 04/05/2024   PLT 291 04/05/2024   Lab Results  Component Value Date   NA 139 04/05/2024   K 4.5 04/05/2024   CL 108 04/05/2024   CO2 27 04/05/2024    RADIOGRAPHIC STUDIES: I have personally reviewed the radiological reports and agreed with the findings in the report.  ASSESSMENT AND PLAN:  Malignant neoplasm of overlapping sites of right breast in female, estrogen receptor negative (HCC) 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%  Pathology and radiology counseling: Discussed with the patient, the details of pathology including the type of breast cancer,the clinical staging, the significance of ER, PR and HER-2/neu receptors and the implications for treatment. After reviewing the pathology in detail, we proceeded to discuss the different treatment options between surgery, radiation, chemotherapy, antiestrogen therapies.  Treatment plan: Neoadjuvant chemotherapy with Taxol carbo Keytruda weekly x 12 followed by Adriamycin Cytoxan Keytruda followed by Sharion maintenance Breast conserving surgery with ALND Adjuvant radiation therapy  Chemotherapy Counseling: I discussed the risks and benefits  of chemotherapy including the risks of nausea/ vomiting, risk of infection from low WBC count, fatigue due to chemo or anemia, bruising or bleeding due to low platelets, mouth sores, loss/ change in taste and decreased appetite. Liver and kidney function will be monitored through out chemotherapy as abnormalities in liver and kidney function may be a side effect of treatment. Cardiac dysfunction due to Adriamycin neuropathy risk from Taxol were discussed in detail. Risk of permanent bone marrow dysfunction and leukemia due to chemo were also discussed.  Plan: Port placement, echocardiogram prior to Adriamycin, chemo class Recommended participation in S2205 clinical trial    Return to clinic in 2 weeks to start chemo  Assessment and Plan Assessment & Plan Type 2 diabetes mellitus, poorly controlled Poorly controlled diabetes with HbA1c of 8. Steroids contraindicated during chemotherapy due to hyperglycemia risk. - Avoided steroids during chemotherapy. - Advised dietary modifications: increase protein and water intake, reduce carbohydrates.     All questions were answered. The patient knows to call  the clinic with any problems, questions or concerns.    Viinay K Verlie Liotta, MD 04/05/24

## 2024-04-05 NOTE — Progress Notes (Signed)
 Radiation Oncology         [phone] ________________________________  Name: Christy Wiley        MRN: [id number]  Date of Service: 04/05/2024 DOB: 06-05-1951  RR:Gnwzd, Aurora, MD  Curvin Deward MOULD, MD     REFERRING PHYSICIAN: Curvin Deward III, MD   DIAGNOSIS: The encounter diagnosis was Malignant neoplasm of overlapping sites of right breast in female, estrogen receptor negative (HCC).   HISTORY OF PRESENT ILLNESS: Christy Wiley is a 73 y.o. female seen in the multidisciplinary breast clinic for a new diagnosis of right breast cancer. The patient was noted to have a screening detected right breast asymmetry. She proceeded with diagnostic work up which showed persistence of the finding on mammogram on 03/07/24. Ultrasound at that time showed a mass in the 10:00 position measuring 19 cm as well as a mass in the 11:00 position measuring 7 mm. One abnormal appearing axillary lymph node was present as well as one subpectoral node. She underwent biopsies on 03/28/24. 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 The 10:00 biopsy showed grade 3 invasive ductal carcinoma as well with associated high grade DCIS. The tumor was Triple Negative with a Ki 67 of 90%. She's seen today to discuss treatment recommendations of her cancer.     PREVIOUS RADIATION THERAPY: No   PAST MEDICAL HISTORY: No past medical history on file.     PAST SURGICAL HISTORY: Past Surgical History:  Procedure Laterality Date   BREAST BIOPSY Right 03/28/2024   US  RT BREAST BX W LOC DEV 1ST LESION IMG BX SPEC US  GUIDE 03/28/2024 [facility]MO[facility]   BREAST BIOPSY Right 03/28/2024   US  RT BREAST BX W LOC DEV EA ADD LESION IMG BX SPEC US  GUIDE 03/28/2024 [facility]AMMO[facility]     FAMILY HISTORY:  Family History  Problem Relation Age of Onset   Breast cancer Neg Hx      SOCIAL HISTORY:   The patient is divorced and lives in [place]. 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 She cares for her brother who lives with her who is legally blind. She was in the process of starting to consider relocating closer to [place] where she is from. Her closest family members who could help support her also live in [place], though she has several local friends she could ask for help from.    ALLERGIES: Penicillin g   MEDICATIONS:  Current Outpatient Medications  Medication Sig Dispense Refill   atorvastatin (LIPITOR) 40 MG tablet Take 40 mg by mouth.     dapagliflozin propanediol (FARXIGA) 5 MG TABS tablet Take 5 mg by mouth.     lisinopril (ZESTRIL) 5 MG tablet Take 5 mg by mouth.     No current facility-administered medications for this encounter.     REVIEW OF SYSTEMS: On review of systems, the patient reports that she is doing well overall but is concerned about her ability to care for her brother. Otherwise she has been very active and hopes to continue being as active. No breast specific complaints are verbalized.      PHYSICAL EXAM:  Wt Readings from Last 3 Encounters:  04/05/24 145 lb 8 oz (66 kg)   Temp Readings from Last 3 Encounters:  04/05/24 (!) 97.3 F (36.3 C) (Temporal)   BP Readings from Last 3 Encounters:  04/05/24 (!) 145/70   Pulse Readings from Last 3 Encounters:  04/05/24 67    In general this is a well appearing African American female in no acute distress. She's alert and oriented x4 and appropriate throughout the examination. Cardiopulmonary assessment is negative for acute distress and she exhibits normal effort. Bilateral breast exam is deferred.    ECOG = 1  0 - Asymptomatic (Fully active, able to carry on all predisease activities without restriction)  1 - Symptomatic but completely ambulatory (Restricted in physically strenuous activity but ambulatory and able to carry out work of a light or sedentary nature. For example, light housework, office work)  2 -  Symptomatic, <50% in bed during the day (Ambulatory and capable of all self care but unable to carry out any work activities. Up and about more than 50% of waking hours)  3 - Symptomatic, >50% in bed, but not bedbound (Capable of only limited self-care, confined to bed or chair 50% or more of waking hours)  4 - Bedbound (Completely disabled. Cannot carry on any self-care. Totally confined to bed or chair)  5 - Death   Raylene MM, Creech RH, Tormey DC, et al. [phone]). Toxicity and response criteria of the [facility]. Am. DOROTHA Bridges. Oncol. 5 (6): 649-55    LABORATORY DATA:  Lab Results  Component Value Date   WBC 5.2 04/05/2024   HGB 13.7 04/05/2024   HCT 41.3 04/05/2024   MCV 86.0 04/05/2024   PLT 291 04/05/2024   Lab Results  Component Value Date   NA 139 04/05/2024   K 4.5 04/05/2024   CL 108 04/05/2024   CO2 27 04/05/2024   Lab Results  Component Value Date   ALT 12 04/05/2024   AST 13 (L) 04/05/2024   ALKPHOS 57 04/05/2024   BILITOT 0.5 04/05/2024      RADIOGRAPHY: US  RT BREAST BX W LOC DEV 1ST LESION IMG BX SPEC US  GUIDE Addendum Date: 03/29/2024 ADDENDUM REPORT: 03/29/2024 13:31 ADDENDUM: Pathology revealed GRADE III INVASIVE DUCTAL CARCINOMA, DUCTAL CARCINOMA IN SITU, SOLID AND CLINGINGLY TYPES, HIGH NUCLEAR GRADE, LYMPHOVASCULAR INVASION: NOT IDENTIFIED, CALCIFICATIONS: NOT IDENTIFIED, OTHER FINDINGS: NONE of the 0.7 cm, 11 o'clock position RIGHT breast mass, (RIBBON clip). This was found to be concordant by Dr. Chyrl Phi. Pathology revealed GRADE III INVASIVE DUCTAL CARCINOMA, DUCTAL CARCINOMA IN SITU, SOLID AND CLINGINGLY TYPES, HIGH NUCLEAR GRADE, LYMPHOVASCULAR INVASION: NOT IDENTIFIED, CALCIFICATIONS: NOT IDENTIFIED, OTHER FINDINGS: NONE of the 1.9 cm, 10 o'clock position RIGHT breast mass, (COIL clip). This was found to be concordant by Dr. Chyrl Phi. 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 This was found to be concordant by Dr. Chyrl Phi. Pathology results were discussed with the patient by telephone. The patient reported doing well after the biopsies with mild tenderness at the sites. Post biopsy instructions and care were reviewed and questions were answered. The patient was encouraged to call [facility] for any additional concerns. My direct phone number was provided. The patient was referred to [facility] on April 05, 2024. Pathology results reported by Hendricks Benders, RN on 03/29/2024. Electronically Signed   By: Reyes Phi M.D.   On: 03/29/2024 13:31   Result Date: 03/29/2024 CLINICAL DATA:  73 year old female presents for tissue sampling of 2 RIGHT breast masses and a RIGHT axillary lymph node. EXAM: ULTRASOUND GUIDED RIGHT BREAST CORE NEEDLE BIOPSY X 2 ULTRASOUND-GUIDED RIGHT AXILLARY LYMPH NODE  CORE NEEDLE BIOPSY COMPARISON:  Previous exam(s). PROCEDURE: I met with the patient and we discussed the procedure of ultrasound-guided biopsy, including benefits and alternatives. We discussed the high likelihood of successful procedures. We discussed the risks of the procedures, including infection, bleeding, tissue injury, clip migration, and inadequate sampling. Informed written consent was given. The usual time-out protocol was performed immediately prior to the procedures. ULTRASOUND GUIDED RIGHT BREAST CORE NEEDLE BIOPSY #1 (0.7 cm 11 o'clock position mass-RIBBON clip): Lesion quadrant: UPPER-OUTER RIGHT breast Using sterile technique and 1% Lidocaine  with and without epinephrine as local anesthetic, under direct ultrasound visualization, a 12 gauge spring-loaded device was used to perform biopsy of the 0.7 cm 11 o'clock position mass 6 cm from the nipple using a LATERAL approach. At the conclusion of the procedure a RIBBON shaped tissue marker clip was  deployed into the biopsy cavity. Follow up 2 view mammogram was performed and dictated separately. ULTRASOUND GUIDED RIGHT BREAST CORE NEEDLE BIOPSY #2 (1.9 cm 10 o'clock position mass-COIL clip): Lesion quadrant: UPPER-OUTER RIGHT breast Using sterile technique and 1% Lidocaine  with and without epinephrine as local anesthetic, under direct ultrasound visualization, a 12 gauge spring-loaded device was used to perform biopsy of the 1.9 cm 10 o'clock position mass 7 cm from the nipple using a LATERAL approach. At the conclusion of the procedure a COIL shaped tissue marker clip was deployed into the biopsy cavity. Follow up 2 view mammogram was performed and dictated separately. ULTRASOUND-GUIDED RIGHT AXILLARY LYMPH NODE CORE NEEDLE BIOPSY: Using sterile technique and 1% Lidocaine  with and without epinephrine as local anesthetic, under direct ultrasound visualization, a 12 gauge spring-loaded device was used to perform biopsy of an abnormal RIGHT axillary lymph node with mild cortical thickening using a LATERAL approach. At the conclusion of the procedure HydroMARK spiral shaped tissue marker clip was deployed into the biopsy cavity. Follow up 2 view mammogram was performed and dictated separately. IMPRESSION: Ultrasound guided biopsy of 0.7 cm 11 o'clock position RIGHT breast mass (RIBBON clip). Ultrasound-guided biopsy of 1.9 cm 10 o'clock position RIGHT breast mass (COIL clip). Ultrasound-guided biopsy of RIGHT axillary lymph node (HydroMARK spiral clip). No apparent complications. Electronically Signed: By: Reyes Phi M.D. On: 03/28/2024 10:37   US  RT BREAST BX W LOC DEV EA ADD LESION IMG BX SPEC US  GUIDE Addendum Date: 03/29/2024 ADDENDUM REPORT: 03/29/2024 13:31 ADDENDUM: Pathology revealed GRADE III INVASIVE DUCTAL CARCINOMA, DUCTAL CARCINOMA IN SITU, SOLID AND CLINGINGLY TYPES, HIGH NUCLEAR GRADE, LYMPHOVASCULAR INVASION: NOT IDENTIFIED, CALCIFICATIONS: NOT IDENTIFIED, OTHER FINDINGS: NONE of the 0.7 cm,  11 o'clock position RIGHT breast mass, (RIBBON clip). This was found to be concordant by Dr. Chyrl Phi. Pathology revealed GRADE III INVASIVE DUCTAL CARCINOMA, DUCTAL CARCINOMA IN SITU, SOLID AND CLINGINGLY TYPES, HIGH NUCLEAR GRADE, LYMPHOVASCULAR INVASION: NOT IDENTIFIED, CALCIFICATIONS: NOT IDENTIFIED, OTHER FINDINGS: NONE of the 1.9 cm, 10 o'clock position RIGHT breast mass, (COIL clip). This was found to be concordant by Dr. Chyrl Phi. 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 This was found to be concordant by Dr. Chyrl Phi. Pathology results were discussed with the patient by telephone. The patient reported doing well after the biopsies with mild tenderness at the sites. Post biopsy instructions and care were reviewed and questions were answered. The patient was encouraged to call [facility] for any additional concerns. My direct phone number was provided. The patient was referred to [facility] at [facility] on April 05, 2024. Pathology results reported by Hendricks Benders, RN on 03/29/2024. Electronically Signed   By: Reyes Phi M.D.   On: 03/29/2024 13:31   Result Date: 03/29/2024 CLINICAL DATA:  73 year old female presents for tissue sampling of 2 RIGHT breast masses and a RIGHT axillary lymph node. EXAM: ULTRASOUND GUIDED RIGHT BREAST CORE NEEDLE BIOPSY X 2 ULTRASOUND-GUIDED RIGHT AXILLARY LYMPH NODE CORE NEEDLE BIOPSY COMPARISON:  Previous exam(s). PROCEDURE: I met with the patient and we discussed the procedure of ultrasound-guided biopsy, including benefits and alternatives. We discussed the high likelihood of successful procedures. We discussed the risks of the procedures, including infection, bleeding, tissue injury, clip migration, and inadequate sampling. Informed written consent was given. The usual time-out protocol was  performed immediately prior to the procedures. ULTRASOUND GUIDED RIGHT BREAST CORE NEEDLE BIOPSY #1 (0.7 cm 11 o'clock position mass-RIBBON clip): Lesion quadrant: UPPER-OUTER RIGHT breast Using sterile technique and 1% Lidocaine  with and without epinephrine as local anesthetic, under direct ultrasound visualization, a 12 gauge spring-loaded device was used to perform biopsy of the 0.7 cm 11 o'clock position mass 6 cm from the nipple using a LATERAL approach. At the conclusion of the procedure a RIBBON shaped tissue marker clip was deployed into the biopsy cavity. Follow up 2 view mammogram was performed and dictated separately. ULTRASOUND GUIDED RIGHT BREAST CORE NEEDLE BIOPSY #2 (1.9 cm 10 o'clock position mass-COIL clip): Lesion quadrant: UPPER-OUTER RIGHT breast Using sterile technique and 1% Lidocaine  with and without epinephrine as local anesthetic, under direct ultrasound visualization, a 12 gauge spring-loaded device was used to perform biopsy of the 1.9 cm 10 o'clock position mass 7 cm from the nipple using a LATERAL approach. At the conclusion of the procedure a COIL shaped tissue marker clip was deployed into the biopsy cavity. Follow up 2 view mammogram was performed and dictated separately. ULTRASOUND-GUIDED RIGHT AXILLARY LYMPH NODE CORE NEEDLE BIOPSY: Using sterile technique and 1% Lidocaine  with and without epinephrine as local anesthetic, under direct ultrasound visualization, a 12 gauge spring-loaded device was used to perform biopsy of an abnormal RIGHT axillary lymph node with mild cortical thickening using a LATERAL approach. At the conclusion of the procedure HydroMARK spiral shaped tissue marker clip was deployed into the biopsy cavity. Follow up 2 view mammogram was performed and dictated separately. IMPRESSION: Ultrasound guided biopsy of 0.7 cm 11 o'clock position RIGHT breast mass (RIBBON clip). Ultrasound-guided biopsy of 1.9 cm 10 o'clock position RIGHT breast mass (COIL clip).  Ultrasound-guided biopsy of RIGHT axillary lymph node (HydroMARK spiral clip). No apparent complications. Electronically Signed: By: Reyes Phi M.D. On: 03/28/2024 10:37   US  AXILLARY NODE CORE BIOPSY RIGHT Addendum Date: 03/29/2024 ADDENDUM REPORT: 03/29/2024 13:31 ADDENDUM: Pathology revealed GRADE III INVASIVE DUCTAL CARCINOMA, DUCTAL CARCINOMA IN SITU, SOLID AND CLINGINGLY TYPES, HIGH NUCLEAR GRADE, LYMPHOVASCULAR INVASION: NOT IDENTIFIED, CALCIFICATIONS: NOT IDENTIFIED, OTHER FINDINGS: NONE of the 0.7 cm, 11 o'clock position RIGHT breast mass, (RIBBON clip). This was found to be concordant by Dr. Chyrl Phi. Pathology revealed GRADE III INVASIVE DUCTAL CARCINOMA, DUCTAL CARCINOMA IN SITU, SOLID AND CLINGINGLY TYPES, HIGH NUCLEAR GRADE, LYMPHOVASCULAR INVASION: NOT IDENTIFIED, CALCIFICATIONS: NOT IDENTIFIED, OTHER FINDINGS: NONE of the 1.9 cm, 10 o'clock position RIGHT breast mass, (COIL clip). This was found to be concordant by Dr. Chyrl Phi. 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 This was found to be concordant by Dr. Chyrl Phi. Pathology  results were discussed with the patient by telephone. The patient reported doing well after the biopsies with mild tenderness at the sites. Post biopsy instructions and care were reviewed and questions were answered. The patient was encouraged to call [facility] for any additional concerns. My direct phone number was provided. The patient was referred to [facility] on April 05, 2024. Pathology results reported by Hendricks Benders, RN on 03/29/2024. Electronically Signed   By: Reyes Phi M.D.   On: 03/29/2024 13:31   Result Date: 03/29/2024 CLINICAL DATA:  73 year old female presents for tissue sampling of 2 RIGHT breast masses and a RIGHT axillary lymph node. EXAM: ULTRASOUND GUIDED  RIGHT BREAST CORE NEEDLE BIOPSY X 2 ULTRASOUND-GUIDED RIGHT AXILLARY LYMPH NODE CORE NEEDLE BIOPSY COMPARISON:  Previous exam(s). PROCEDURE: I met with the patient and we discussed the procedure of ultrasound-guided biopsy, including benefits and alternatives. We discussed the high likelihood of successful procedures. We discussed the risks of the procedures, including infection, bleeding, tissue injury, clip migration, and inadequate sampling. Informed written consent was given. The usual time-out protocol was performed immediately prior to the procedures. ULTRASOUND GUIDED RIGHT BREAST CORE NEEDLE BIOPSY #1 (0.7 cm 11 o'clock position mass-RIBBON clip): Lesion quadrant: UPPER-OUTER RIGHT breast Using sterile technique and 1% Lidocaine  with and without epinephrine as local anesthetic, under direct ultrasound visualization, a 12 gauge spring-loaded device was used to perform biopsy of the 0.7 cm 11 o'clock position mass 6 cm from the nipple using a LATERAL approach. At the conclusion of the procedure a RIBBON shaped tissue marker clip was deployed into the biopsy cavity. Follow up 2 view mammogram was performed and dictated separately. ULTRASOUND GUIDED RIGHT BREAST CORE NEEDLE BIOPSY #2 (1.9 cm 10 o'clock position mass-COIL clip): Lesion quadrant: UPPER-OUTER RIGHT breast Using sterile technique and 1% Lidocaine  with and without epinephrine as local anesthetic, under direct ultrasound visualization, a 12 gauge spring-loaded device was used to perform biopsy of the 1.9 cm 10 o'clock position mass 7 cm from the nipple using a LATERAL approach. At the conclusion of the procedure a COIL shaped tissue marker clip was deployed into the biopsy cavity. Follow up 2 view mammogram was performed and dictated separately. ULTRASOUND-GUIDED RIGHT AXILLARY LYMPH NODE CORE NEEDLE BIOPSY: Using sterile technique and 1% Lidocaine  with and without epinephrine as local anesthetic, under direct ultrasound visualization, a 12 gauge  spring-loaded device was used to perform biopsy of an abnormal RIGHT axillary lymph node with mild cortical thickening using a LATERAL approach. At the conclusion of the procedure HydroMARK spiral shaped tissue marker clip was deployed into the biopsy cavity. Follow up 2 view mammogram was performed and dictated separately. IMPRESSION: Ultrasound guided biopsy of 0.7 cm 11 o'clock position RIGHT breast mass (RIBBON clip). Ultrasound-guided biopsy of 1.9 cm 10 o'clock position RIGHT breast mass (COIL clip). Ultrasound-guided biopsy of RIGHT axillary lymph node (HydroMARK spiral clip). No apparent complications. Electronically Signed: By: Reyes Phi M.D. On: 03/28/2024 10:37   MM CLIP PLACEMENT RIGHT Result Date: 03/28/2024 CLINICAL DATA:  Evaluate placement of biopsy clips following ultrasound-guided RIGHT breast and axillary biopsies. EXAM: 3D DIAGNOSTIC RIGHT MAMMOGRAM POST ULTRASOUND BIOPSY COMPARISON:  Previous exam(s). ACR Breast Density Category c: The breasts are heterogeneously dense, which may obscure small masses. FINDINGS: 3D Mammographic images were obtained following ultrasound guided biopsy of 0.7 cm 11 o'clock position mass (RIBBON clip), 1.9 cm 10 o'clock position mass (COIL clip) and a  RIGHT axillary lymph node (spiral clip). The RIBBON biopsy marking clip is in expected position at the site of biopsy. The COIL biopsy marking clip is in expected position at the site of biopsy. The RIBBON and COIL biopsy clips are separated by a distance of 3 cm. The [facility] spiral clip is not visualized due to far posterior position, but satisfactory placement within the biopsied lymph node was confirmed sonographically. IMPRESSION: Appropriate positioning of the RIBBON shaped biopsy marking clip at the site of biopsy in the UPPER-OUTER RIGHT breast. Appropriate positioning of the COIL shaped biopsy marking clip at the site of biopsy in the UPPER OUTER RIGHT breast. RIBBON and COIL biopsy clip separated by a  distance of 3 cm. HydroMARK spiral shaped biopsy marking clip not visualized on this study but satisfactory placement within the biopsied lymph node was confirmed sonographically. Final Assessment: Post Procedure Mammograms for Marker Placement Electronically Signed   By: Reyes Phi M.D.   On: 03/28/2024 11:19   MM 3D DIAGNOSTIC MAMMOGRAM UNILATERAL RIGHT BREAST Result Date: 03/07/2024 CLINICAL DATA:  73 year old female presents for further evaluation of possible RIGHT breast asymmetry on screening mammogram. EXAM: DIGITAL DIAGNOSTIC UNILATERAL RIGHT MAMMOGRAM WITH TOMOSYNTHESIS AND CAD; ULTRASOUND RIGHT BREAST LIMITED TECHNIQUE: Right digital diagnostic mammography and breast tomosynthesis was performed. The images were evaluated with computer-aided detection. ; Targeted ultrasound examination of the right breast was performed COMPARISON:  Previous exam(s). ACR Breast Density Category c: The breasts are heterogeneously dense, which may obscure small masses. FINDINGS: Full field and spot compression views of the RIGHT breast demonstrate 2 persistent obscured masses within the middle to posterior UPPER-OUTER RIGHT breast. Targeted ultrasound is performed, showing the following: A 1.1 x 0.7 x 1.9 cm mixed echogenicity mass at the 10 o'clock position of the RIGHT breast 7 cm from the nipple. A 0.6 x 0.6 x 0.7 cm irregular hypoechoic mass at the 11 o'clock position of the RIGHT breast 6 cm from the nipple. A 0.6 cm subpectoral RIGHT axillary lymph node and 2 RIGHT axillary lymph nodes with mild focal cortical thickening of 3 mm noted. IMPRESSION: 1. Suspicious 1.9 cm 10 o'clock position RIGHT breast mass and suspicious 0.7 cm 11 o'clock position RIGHT breast mass. Tissue sampling of both these masses recommended. 2. 0.6 cm subpectoral RIGHT axillary lymph node and 2 RIGHT axillary lymph nodes with mild focal cortical thickening. Tissue sampling of 1 of the RIGHT axillary lymph nodes is recommended. RECOMMENDATION:  Ultrasound-guided biopsies of the 2 RIGHT breast masses and 1 of the RIGHT axillary lymph nodes with focal cortical thickening. I have discussed the findings and recommendations with the patient. If applicable, a reminder letter will be sent to the patient regarding the next appointment. BI-RADS CATEGORY  4: Suspicious. Electronically Signed   By: Reyes Phi M.D.   On: 03/07/2024 12:17   US  LIMITED ULTRASOUND INCLUDING AXILLA RIGHT BREAST Result Date: 03/07/2024 CLINICAL DATA:  73 year old female presents for further evaluation of possible RIGHT breast asymmetry on screening mammogram. EXAM: DIGITAL DIAGNOSTIC UNILATERAL RIGHT MAMMOGRAM WITH TOMOSYNTHESIS AND CAD; ULTRASOUND RIGHT BREAST LIMITED TECHNIQUE: Right digital diagnostic mammography and breast tomosynthesis was performed. The images were evaluated with computer-aided detection. ; Targeted ultrasound examination of the right breast was performed COMPARISON:  Previous exam(s). ACR Breast Density Category c: The breasts are heterogeneously dense, which may obscure small masses. FINDINGS: Full field and spot compression views of the RIGHT breast demonstrate 2 persistent obscured masses within the middle to posterior UPPER-OUTER RIGHT breast. Targeted ultrasound is  performed, showing the following: A 1.1 x 0.7 x 1.9 cm mixed echogenicity mass at the 10 o'clock position of the RIGHT breast 7 cm from the nipple. A 0.6 x 0.6 x 0.7 cm irregular hypoechoic mass at the 11 o'clock position of the RIGHT breast 6 cm from the nipple. A 0.6 cm subpectoral RIGHT axillary lymph node and 2 RIGHT axillary lymph nodes with mild focal cortical thickening of 3 mm noted. IMPRESSION: 1. Suspicious 1.9 cm 10 o'clock position RIGHT breast mass and suspicious 0.7 cm 11 o'clock position RIGHT breast mass. Tissue sampling of both these masses recommended. 2. 0.6 cm subpectoral RIGHT axillary lymph node and 2 RIGHT axillary lymph nodes with mild focal cortical thickening. Tissue  sampling of 1 of the RIGHT axillary lymph nodes is recommended. RECOMMENDATION: Ultrasound-guided biopsies of the 2 RIGHT breast masses and 1 of the RIGHT axillary lymph nodes with focal cortical thickening. I have discussed the findings and recommendations with the patient. If applicable, a reminder letter will be sent to the patient regarding the next appointment. BI-RADS CATEGORY  4: Suspicious. Electronically Signed   By: Reyes Phi M.D.   On: 03/07/2024 12:17       IMPRESSION/PLAN: 1. Stage IIB, [id number], grade 3 Triple Negative, invasive ductal carcinoma of the right breast. Dr. Dewey discusses the pathology findings and reviews the nature of early stage right breast disease. The consensus from the breast conference includes neoadjuvant chemotherapy followed by breast conservation with lumpectomy with targeted node dissection. Dr. Dewey discusses the role of external radiotherapy to the breast and regional nodes to reduce risks of local recurrence. We discussed the risks, benefits, short, and long term effects of radiotherapy, as well as the curative intent, and the patient is interested in proceeding. Dr. Dewey discusses the delivery and logistics of radiotherapy and anticipates a course of 6 1/2  weeks of radiotherapy to the right breast and regional nodes We will see her back a few weeks after surgery to discuss the simulation process and anticipate we starting radiotherapy about 4-6 weeks after surgery. If she were toi relocate to the [place] or [place] area prior to radiotherapy, we'd be happy to help coordinate her care there locally. 2. Possible genetic predisposition to malignancy. The patient is a candidate for genetic testing given her personal history. She will meet with our geneticist today in clinic.   In a visit lasting 60 minutes, greater than 50% of the time was spent face to face reviewing her case, as well as in preparation of, discussing, and coordinating the patient's care.  The  above documentation reflects my direct findings during this shared patient visit. Please see the separate note by Dr. Dewey on this date for the remainder of the patient's plan of care.    Donald KYM Husband, [facility]    **Disclaimer: This note was dictated with voice recognition software. Similar sounding words can inadvertently be transcribed and this note may contain transcription errors which may not have been corrected upon publication of note.**

## 2024-04-05 NOTE — Research (Signed)
 [employer] 2021-05 - Specimen Collection Study to Evaluate Biomarkers in Subjects with Cancer     [name] was identified by Dr.Gudena as a potential candidate for the above listed study.  This Clinical Research Nurse met with [name], [id number], on 04/05/24 in a manner and location that ensures patient privacy to discuss participation in the above listed research study.  Patient is Unaccompanied.  A copy of the informed consent document with embedded HIPAA language was provided to the patient.  Patient reads, speaks, and understands [place].   Patient was provided with the business card of this Nurse and encouraged to contact the research team with any questions.  Approximately 10 minutes were spent with the patient reviewing the informed consent documents.  Patient was provided the option of taking informed consent documents home to review and was encouraged to review at their convenience with their support network, including other care providers. Patient took the consent documents home to review. The pt was informed that she will need to consent and have her blood drawn before her surgery or before any treatment. The pt was told that her participation is optional for this study and that she would get a $50 gift card if she participates in the study with a successful blood sample. Pt asked for a follow up phone call on Tuesday 04/11/24 at 9:30am.   [name], RN, BSN Clinical Research Nurse [phone] 04/05/2024

## 2024-04-05 NOTE — Progress Notes (Signed)
 [facility] Multidisciplinary Clinic Spiritual Care Note  Met with Markiyah in Breast Multidisciplinary Clinic to introduce Support Center team/resources.  She completed SDOH screening; results follow below.    SDOH Screenings   Food Insecurity: No Food Insecurity (04/05/2024)  Housing: Low Risk  (04/05/2024)  Transportation Needs: No Transportation Needs (04/05/2024)  Utilities: Not At Risk (04/05/2024)  Depression (PHQ2-9): Low Risk  (04/05/2024)  Tobacco Use: Medium Risk (04/05/2024)   Chaplain and patient discussed common feelings and emotions when being diagnosed with cancer, and the importance of support during treatment.  Chaplain informed patient of the support team and support services at [facility].  Chaplain provided contact information and encouraged patient to call with any questions or concerns.  Follow up needed: Yes.  Plan to follow up by phone in ca two weeks for follow-up support and to revisit interest in an Sports coach.   [address] Olam Corrigan, [place], [facility] Pager [phone] Voicemail [phone]

## 2024-04-05 NOTE — Assessment & Plan Note (Addendum)
 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 After reviewing the pathology in detail, we proceeded to discuss the different treatment options between surgery, radiation, chemotherapy, antiestrogen therapies.  Treatment plan: Neoadjuvant chemotherapy with Taxol carbo Keytruda weekly x 12 followed by Adriamycin Cytoxan Keytruda followed by Sharion maintenance Breast conserving surgery with ALND Adjuvant radiation therapy  Chemotherapy Counseling: I discussed the risks and benefits of chemotherapy including the risks of nausea/ vomiting, risk of infection from low WBC count, fatigue due to chemo or anemia, bruising or bleeding due to low platelets, mouth sores, loss/ change in taste and decreased appetite. Liver and kidney function will be monitored through out chemotherapy as abnormalities in liver and kidney function may be a side effect of treatment. Cardiac dysfunction due to Adriamycin neuropathy risk from Taxol were discussed in detail. Risk of permanent bone marrow dysfunction and leukemia due to chemo were also discussed.  Plan: Port placement, echocardiogram prior to Adriamycin, chemo class Discussed participation in clinical trial SCARLET: Patient was randomized between 1 arm that has Adriamycin in the other arm that does not.  Return to clinic in 2 weeks to start chemo

## 2024-04-05 NOTE — Progress Notes (Signed)
 START ON PATHWAY REGIMEN - Breast     Cycles 1 through 4: A cycle is every 21 days:     Pembrolizumab      Paclitaxel      Carboplatin      Filgrastim-xxxx    Cycles 5 through 8: A cycle is every 21 days:     Pembrolizumab      Doxorubicin      Cyclophosphamide      Pegfilgrastim-xxxx   **Always confirm dose/schedule in your pharmacy ordering system**  Patient Characteristics: Preoperative or Nonsurgical Candidate, M0 (Clinical Staging), Up to cT4c, Any N, M0, Neoadjuvant Therapy followed by Surgery, Invasive Disease, Chemotherapy, HER2 Negative, ER Negative, Platinum Therapy Indicated and Candidate for Checkpoint Inhibitor Therapeutic Status: Preoperative or Nonsurgical Candidate, M0 (Clinical Staging) AJCC M Category: cM0 AJCC Grade: G3 ER Status: Negative (-) AJCC 8 Stage Grouping: IIB HER2 Status: Negative (-) AJCC T Category: cT1c AJCC N Category: cN1 PR Status: Negative (-) Breast Surgical Plan: Neoadjuvant Therapy followed by Surgery Intent of Therapy: Curative Intent, Discussed with Patient

## 2024-04-05 NOTE — Therapy (Signed)
 OUTPATIENT PHYSICAL THERAPY BREAST CANCER BASELINE EVALUATION   Patient Name: Christy Wiley MRN: [id number] DOB:01-23-1951, 73 y.o., female Today's Date: 04/05/2024  END OF SESSION:  PT End of Session - 04/05/24 1713     Visit Number 1    Number of Visits 2    Date for PT Re-Evaluation 05/31/24    PT Start Time 1500    PT Stop Time 1534    PT Time Calculation (min) 34 min    Activity Tolerance Patient tolerated treatment well    Behavior During Therapy [facility] for tasks assessed/performed          Past Medical History:  Diagnosis Date   Diabetes [facility])    Past Surgical History:  Procedure Laterality Date   BREAST BIOPSY Right 03/28/2024   US  RT BREAST BX W LOC DEV 1ST LESION IMG BX SPEC US  GUIDE 03/28/2024 [facility] MAMMOGRAPHY   BREAST BIOPSY Right 03/28/2024   US  RT BREAST BX W LOC DEV EA ADD LESION IMG BX SPEC US  GUIDE 03/28/2024 [facility] MAMMOGRAPHY   PARTIAL HYSTERECTOMY     Patient Active Problem List   Diagnosis Date Noted   Malignant neoplasm of overlapping sites of right breast in female, estrogen receptor negative (HCC) 04/04/2024    REFERRING PROVIDER: Dr. Deward Null  REFERRING DIAG: Right breast cancer  THERAPY DIAG:  Malignant neoplasm of upper-outer quadrant of right breast in female, estrogen receptor negative (HCC)  Abnormal posture  Acute pain of left shoulder  Rationale for Evaluation and Treatment: Rehabilitation  ONSET DATE: 02/18/2024  SUBJECTIVE:                                                                                                                                                                                           SUBJECTIVE STATEMENT: Patient reports she is here today to be seen by her medical team for her newly diagnosed right breast cancer.   PERTINENT HISTORY:  Patient was diagnosed on 02/18/2024 with right grade 3 invasive ductal carcinoma breast cancer. It measures 1.9 cm and 7 mm and is located in the upper outer quadrant. It  is triple negative with a Ki67 of 90%. She has a biopsied positive axillary node.  PATIENT GOALS:   reduce lymphedema risk and learn post op HEP.   PAIN:  Are you having pain? Yes: NPRS scale: varies - with certain movements it's very painful Pain location: left shoulder Pain description: tingling down arm and sharp pain in shoulder Aggravating factors: internal and external rotation Relieving factors: rest  PRECAUTIONS: Active CA   RED FLAGS: None   HAND DOMINANCE: right  WEIGHT BEARING RESTRICTIONS: No  FALLS:  Has patient fallen in last 6 months? No  LIVING ENVIRONMENT: Patient lives with: her brother who requires care from her due to blindness Lives in: House/apartment Has following equipment at home: None  OCCUPATION: Retired  LEISURE: She reports that she uses the ab roller sometimes  PRIOR LEVEL OF FUNCTION: Independent   OBJECTIVE: Note: Objective measures were completed at Evaluation unless otherwise noted.  COGNITION: Overall cognitive status: Within functional limits for tasks assessed    POSTURE:  Forward head and rounded shoulders posture  UPPER EXTREMITY AROM/PROM:  A/PROM RIGHT   eval   Shoulder extension 46  Shoulder flexion 157  Shoulder abduction 169  Shoulder internal rotation 62  Shoulder external rotation 88    (Blank rows = not tested)  A/PROM LEFT   eval  Shoulder extension 42  Shoulder flexion 142  Shoulder abduction 169  Shoulder internal rotation 52 and painful  Shoulder external rotation 79 and painful    (Blank rows = not tested)  CERVICAL AROM: All within normal limits  UPPER EXTREMITY STRENGTH: WFL but not tested on left due to new onset of left shoulder pain  LYMPHEDEMA ASSESSMENTS (in cm):   LANDMARK RIGHT   eval  10 cm proximal to olecranon process 27.9  Olecranon process 23.8  10 cm proximal to ulnar styloid process 22.4  Just proximal to ulnar styloid process 15.3  Across hand at thumb web space 19.3   At base of 2nd digit 6.3  (Blank rows = not tested)  LANDMARK LEFT   eval  10 cm proximal to olecranon process 29  Olecranon process 24.6  10 cm proximal to ulnar styloid process 21.6  Just proximal to ulnar styloid process 15.3  Across hand at thumb web space 19.1  At base of 2nd digit 5.9  (Blank rows = not tested)  L-DEX LYMPHEDEMA SCREENING:  The patient was assessed using the L-Dex machine today to produce a lymphedema index baseline score. The patient will be reassessed on a regular basis (typically every 3 months) to obtain new L-Dex scores. If the score is > 6.5 points away from his/her baseline score indicating onset of subclinical lymphedema, it will be recommended to wear a compression garment for 4 weeks, 12 hours per day and then be reassessed. If the score continues to be > 6.5 points from baseline at reassessment, we will initiate lymphedema treatment. Assessing in this manner has a 95% rate of preventing clinically significant lymphedema.   L-DEX FLOWSHEETS - 04/05/24 1700       L-DEX LYMPHEDEMA SCREENING   Measurement Type Unilateral    L-DEX MEASUREMENT EXTREMITY Upper Extremity    POSITION  Standing    DOMINANT SIDE Right    At Risk Side Right    BASELINE SCORE (UNILATERAL) -7.2          QUICK DASH SURVEY:  Christy Wiley - 04/05/24 0001     Open a tight or new jar Moderate difficulty    Do heavy household chores (wash walls, wash floors) No difficulty    Carry a shopping bag or briefcase Mild difficulty    Wash your back Mild difficulty    Use a knife to cut food No difficulty    Recreational activities in which you take some force or impact through your arm, shoulder, or hand (golf, hammering, tennis) Mild difficulty    During the past week, to what extent has your arm, shoulder or hand problem interfered with your normal social activities with family, friends,  neighbors, or groups? Slightly    During the past week, to what extent has your arm, shoulder or  hand problem limited your work or other regular daily activities Not at all    Arm, shoulder, or hand pain. Mild    Tingling (pins and needles) in your arm, shoulder, or hand Mild    DASH Score 15.91 %           PATIENT EDUCATION:  Education details: Time spent educating patient on aspects of self-care to maximize post op recovery. Patient was educated on where and how to get a post op compression bra to use to reduce post op edema. Patient was also educated on the use of SOZO screenings and surveillance principles for early identification of lymphedema onset. She was instructed to use the post op pillow in the axilla for pressure and pain relief. Patient educated on lymphedema risk reduction and post op shoulder/posture HEP. Person educated: Patient Education method: Explanation, Demonstration, Handout Education comprehension: Patient verbalized understanding and returned demonstration  HOME EXERCISE PROGRAM: Patient was instructed today in a home exercise program today for post op shoulder range of motion. These included active assist shoulder flexion in sitting, scapular retraction, wall walking with shoulder abduction, and hands behind head external rotation.  She was encouraged to do these twice a day, holding 3 seconds and repeating 5 times when permitted by her physician.   ASSESSMENT:  CLINICAL IMPRESSION: Patient was diagnosed on 02/18/2024 with right grade 3 invasive ductal carcinoma breast cancer. It measures 1.9 cm and 7 mm and is located in the upper outer quadrant. It is triple negative with a Ki67 of 90%. She has a biopsied positive axillary node. Her multidisciplinary medical team met prior to her assessments to determine a recommended treatment plan. She is planning to have neoadjuvant chemotherapy followed by a right lumpectomy and targeted axillary lymph node dissection and radiation. She will benefit from a post op PT reassessment to determine needs and from L-Dex screens  every 3 months for 2 years to detect subclinical lymphedema.  Pt will benefit from skilled therapeutic intervention to improve on the following deficits: Decreased knowledge of precautions, impaired UE functional use, pain, decreased ROM, postural dysfunction.   PT treatment/interventions: ADL/self-care home management, pt/family education, therapeutic exercise  REHAB POTENTIAL: Excellent  CLINICAL DECISION MAKING: Stable/uncomplicated  EVALUATION COMPLEXITY: Low   GOALS: Goals reviewed with patient? YES  LONG TERM GOALS: (STG=LTG)    Name Target Date Goal status  1 Pt will be able to verbalize understanding of pertinent lymphedema risk reduction practices relevant to her dx specifically related to skin care.  Baseline:  No knowledge 04/05/2024 Achieved at eval  2 Pt will be able to return demo and/or verbalize understanding of the post op HEP related to regaining shoulder ROM. Baseline:  No knowledge 04/05/2024 Achieved at eval  3 Pt will be able to verbalize understanding of the importance of viewing the post op After Breast CA Class video for further lymphedema risk reduction education and therapeutic exercise.  Baseline:  No knowledge 04/05/2024 Achieved at eval  4 Pt will demo she has regained full shoulder ROM and function post operatively compared to baselines.  Baseline: See objective measurements taken today. 05/31/2024     PLAN:  PT FREQUENCY/DURATION: EVAL and 1 follow up appointment.   PLAN FOR NEXT SESSION: will reassess 3-4 weeks post op to determine needs.   Patient will follow up at outpatient cancer rehab 3-4 weeks following surgery.  If the patient  requires physical therapy at that time, a specific plan will be dictated and sent to the referring physician for approval. The patient was educated today on appropriate basic range of motion exercises to begin post operatively and the importance of viewing the After Breast Cancer class video following surgery.  Patient was  educated today on lymphedema risk reduction practices as it pertains to recommendations that will benefit the patient immediately following surgery.  She verbalized good understanding.    Physical Therapy Information for After Breast Cancer Surgery/Treatment:  Lymphedema is a swelling condition that you may be at risk for in your arm if you have lymph nodes removed from the armpit area.  After a sentinel node biopsy, the risk is approximately 5-9% and is higher after an axillary node dissection.  There is treatment available for this condition and it is not life-threatening.  Contact your physician or physical therapist with concerns. You may begin the 4 shoulder/posture exercises (see additional sheet) when permitted by your physician (typically a week after surgery).  If you have drains, you may need to wait until those are removed before beginning range of motion exercises.  A general recommendation is to not lift your arms above shoulder height until drains are removed.  These exercises should be done to your tolerance and gently.  This is not a no pain/no gain type of recovery so listen to your body and stretch into the range of motion that you can tolerate, stopping if you have pain.  If you are having immediate reconstruction, ask your plastic surgeon about doing exercises as he or she may want you to wait. We encourage you to view the After Breast Cancer class video following surgery.  You will learn information related to lymphedema risk, prevention and treatment and additional exercises to regain mobility following surgery.   While undergoing any medical procedure or treatment, try to avoid blood pressure being taken or needle sticks from occurring on the arm on the side of cancer.   This recommendation begins after surgery and continues for the rest of your life.  This may help reduce your risk of getting lymphedema (swelling in your arm). An excellent resource for those seeking information on  lymphedema is the National Lymphedema Network's web site. It can be accessed at www.lymphnet.org If you notice swelling in your hand, arm or breast at any time following surgery (even if it is many years from now), please contact your doctor or physical therapist to discuss this.  Lymphedema can be treated at any time but it is easier for you if it is treated early on.  If you feel like your shoulder motion is not returning to normal in a reasonable amount of time, please contact your surgeon or physical therapist.  [facility] [phone]. [address], [address], [place] [address]  [facility] CLASS After Breast Cancer Class  After Breast Cancer Class is a specially designed exercise class video to assist you in a safe recover after having breast cancer surgery.  In this video you will learn how to get back to full function whether your drains were just removed or if you had surgery a month ago. The video can be viewed on this page: [email or web address]-meyer.org/ or on YouTube here: [email or web address].  Class Goals  Understand specific stretches to improve the flexibility of you chest and shoulder. Learn ways to safely strengthen your upper body and improve your posture. Understand the warning signs of infection and why you may be  at risk for an arm infection. Learn about Lymphedema and prevention.  ** You do not need to view this video until after surgery.  Drains should be removed to participate in the recommended exercises on the video.  Patient was instructed today in a home exercise program today for post op shoulder range of motion. These included active assist shoulder flexion in sitting, scapular retraction, wall walking with shoulder abduction, and hands behind head external rotation.  She was encouraged to do these twice a day, holding 3 seconds and repeating 5 times when permitted by her  physician.  Eward Wonda Sharps, [place] 04/05/24 5:22 PM

## 2024-04-06 ENCOUNTER — Encounter: Payer: Self-pay | Admitting: Hematology and Oncology

## 2024-04-06 ENCOUNTER — Other Ambulatory Visit: Payer: Self-pay

## 2024-04-06 ENCOUNTER — Ambulatory Visit (HOSPITAL_COMMUNITY)
Admission: RE | Admit: 2024-04-06 | Discharge: 2024-04-06 | Disposition: A | Discharge status: Home / Self Care | Source: Ambulatory Visit | Attending: Hematology and Oncology | Admitting: Hematology and Oncology

## 2024-04-06 DIAGNOSIS — C50811 Malignant neoplasm of overlapping sites of right female breast: Secondary | ICD-10-CM | POA: Diagnosis present

## 2024-04-06 DIAGNOSIS — I3139 Other pericardial effusion (noninflammatory): Secondary | ICD-10-CM | POA: Diagnosis not present

## 2024-04-06 DIAGNOSIS — Z171 Estrogen receptor negative status [ER-]: Secondary | ICD-10-CM

## 2024-04-06 DIAGNOSIS — Z0189 Encounter for other specified special examinations: Secondary | ICD-10-CM | POA: Diagnosis not present

## 2024-04-06 DIAGNOSIS — I082 Rheumatic disorders of both aortic and tricuspid valves: Secondary | ICD-10-CM | POA: Diagnosis not present

## 2024-04-06 LAB — ECHOCARDIOGRAM COMPLETE
AR max vel: 2.66 cm2
AV Area VTI: 2.54 cm2
AV Area mean vel: 2.68 cm2
AV Mean grad: 3 mmHg
AV Peak grad: 6.1 mmHg
Ao pk vel: 1.23 m/s
Area-P 1/2: 4.1 cm2
Calc EF: 66.9 %
MV VTI: 1.83 cm2
P 1/2 time: 516 ms
S' Lateral: 2.4 cm
Single Plane A2C EF: 68.5 %
Single Plane A4C EF: 64.9 %

## 2024-04-06 NOTE — Research (Signed)
 S2205, ICE COMPRESS: RANDOMIZED TRIAL OF LIMB CRYOCOMPRESSION VERSUS CONTINUOUS COMPRESSION VERSUS LOW CYCLIC COMPRESSION FOR THE PREVENTION  OF TAXANE-INDUCED PERIPHERAL NEUROPATHY    [name] was identified by Dr.Gudena as a potential candidate for the above listed study.  This Clinical Research Nurse met with Christy Wiley, [id number], on 04/05/2024 in a manner and location that ensures patient privacy to discuss participation in the above listed research study.  Patient is Unaccompanied.  A copy of the informed consent document and separate HIPAA Authorization was provided to the patient.  Patient reads, speaks, and understands [place].   Patient was provided with the business card of this Nurse and encouraged to contact the research team with any questions.  Approximately 15 minutes were spent with the patient reviewing the informed consent documents.  Patient was provided the option of taking informed consent documents home to review and was encouraged to review at their convenience with their support network, including other care providers. Patient took the consent documents home to review.  Pt was given the S2205 What to expect brochure.   Pt was informed that her participation is optional. She had no questions for the research nurse about the above study. The pt asked that the research nurse call her in next week on 9/9//25 at 9:30 am to discuss her participation in the study.   Christy Larsen, RN, BSN Clinical Research Nurse [phone] 04/06/2024

## 2024-04-07 ENCOUNTER — Other Ambulatory Visit: Payer: Self-pay

## 2024-04-07 ENCOUNTER — Encounter: Payer: Self-pay | Admitting: Genetic Counselor

## 2024-04-07 ENCOUNTER — Encounter: Payer: Self-pay | Admitting: Hematology and Oncology

## 2024-04-07 DIAGNOSIS — Z8041 Family history of malignant neoplasm of ovary: Secondary | ICD-10-CM | POA: Insufficient documentation

## 2024-04-07 DIAGNOSIS — Z8042 Family history of malignant neoplasm of prostate: Secondary | ICD-10-CM | POA: Insufficient documentation

## 2024-04-07 NOTE — Progress Notes (Unsigned)
 [facility]       Telephone: [phone]?Fax: [phone]   Oncology Clinical Pharmacist Practitioner Initial Assessment  Adreanne Yono is a 73 y.o. female with a diagnosis of breast cancer. They were contacted today via in-person visit.  Indication/Regimen Pembrolizumab  (Keytruda ), paclitaxel  (Taxol ), carboplatin  (Paraplatin  ) followed by pembrolizumab  (Keytruda ), doxorubicin (Adriamycin), cyclophosphamide (Cytoxan) is being used appropriately for treatment of breast cancer by Dr. Vinay Gudena.      Wt Readings from Last 1 Encounters:  04/12/24 145 lb (65.8 kg)    Estimated body surface area is 1.71 meters squared as calculated from the following:   Height as of 04/10/24: 5' 3 (1.6 m).   Weight as of this encounter: 145 lb (65.8 kg).  The dosing regimen cycle is every 21 days x 4 cycles  Pembrolizumab  (200 mg) on Day 1 Paclitaxel  (80 mg/m2) on Days 1, 8, 15 Carboplatin  (AUC 1.5) on Day 1, 8, 15 Filgrastim (300 mcg or 480 mcg based on weight)  on Days 16, 17, 18  Followed by the below regimen cycle every 21 days x 4 cycles  Pembrolizumab  (200 mg) Day 1 Doxorubicin (60 mg/m2) Day 1 Cyclophosphamide (600 mg/m2) Day 1 Pegfilgrastim (6 mg) on Day 3  It is planned to continue until treatment plan completion or unacceptable toxicity. The tentative start date is: 04/18/24  Dose Modifications Dr. Gudena has lowered dose of carboplatin  to AUC 1. He has also removed dexamethasone  from prescription take-home meds   Allergies Allergies  Allergen Reactions   Penicillin G Other (See Comments)    Vitals    04/12/2024    9:16 AM 04/10/2024    9:25 AM 04/05/2024    1:16 PM  Oncology Vitals  Height  160 cm   Weight 65.772 kg 66 kg 65.998 kg  Weight (lbs) 145 lbs 145 lbs 8 oz 145 lbs 8 oz  BMI 25.69 kg/m2 25.77 kg/m2   Temp 97.3 F (36.3 C)  97.3 F (36.3 C)  Pulse Rate 82  67  BP 157/82  145/70  Resp 16  16  SpO2 100 %  98 %  BSA (m2) 1.71 m2 1.71 m2       Laboratory Data    Latest Ref Rng & Units 04/05/2024    1:04 PM  CBC EXTENDED  WBC 4.0 - 10.5 K/uL 5.2   RBC 3.87 - 5.11 MIL/uL 4.80   Hemoglobin 12.0 - 15.0 g/dL 86.2   HCT 63.9 - 53.9 % 41.3   Platelets 150 - 400 K/uL 291   NEUT# 1.7 - 7.7 K/uL 2.6   Lymph# 0.7 - 4.0 K/uL 2.0        Latest Ref Rng & Units 04/05/2024    1:04 PM  CMP  Glucose 70 - 99 mg/dL 879   BUN 8 - 23 mg/dL 14   Creatinine 9.55 - 1.00 mg/dL 9.31   Sodium 864 - 854 mmol/L 139   Potassium 3.5 - 5.1 mmol/L 4.5   Chloride 98 - 111 mmol/L 108   CO2 22 - 32 mmol/L 27   Calcium 8.9 - 10.3 mg/dL 9.5   Total Protein 6.5 - 8.1 g/dL 7.6   Total Bilirubin 0.0 - 1.2 mg/dL 0.5   Alkaline Phos 38 - 126 U/L 57   AST 15 - 41 U/L 13   ALT 0 - 44 U/L 12    Contraindications Contraindications were reviewed? Yes Contraindications to therapy were identified? No   Safety Precautions The following safety precautions were  reviewed:  Fever: reviewed the importance of having a thermometer and the Centers for Disease Control and Prevention (CDC) definition of fever which is 100.26F (38C) or higher. Patient should call 24/7 triage at [phone] if experiencing a fever or any other symptoms Decreased white blood cells (WBCs) and increased risk for infection Decreased platelet count and increased risk of bleeding Decreased hemoglobin, part of the red blood cells that carry iron and oxygen Nausea or vomiting Diarrhea or constipation Hair Loss Fatigue Changes in liver function Rash Peripheral Neuropathy Hypersensitivity reactions Pembrolizumab  toxicities (skin, lung, liver, thyroid , kidneys, vision) Changes in color of urine Mouth sores MDS/AML Grapefruit products may interact with paclitaxel . Avoid use Handling body fluids and waste Intimacy, sexual activity, contraception, fertility  Medication Reconciliation Current Outpatient Medications  Medication Sig Dispense Refill   atorvastatin (LIPITOR) 40 MG tablet  Take 40 mg by mouth.     lisinopril (ZESTRIL) 5 MG tablet Take 5 mg by mouth.     meloxicam (MOBIC) 7.5 MG tablet Take 7.5 mg by mouth daily as needed.     Tirzepatide (MOUNJARO [place]) Inject into the skin.     lidocaine -prilocaine  (EMLA ) cream Apply to affected area once (Patient not taking: Reported on 04/12/2024) 30 g 3   ondansetron  (ZOFRAN ) 8 MG tablet Take 1 tablet (8 mg total) by mouth every 8 (eight) hours as needed for nausea or vomiting. Start on the third day after chemotherapy. (Patient not taking: Reported on 04/12/2024) 30 tablet 1   prochlorperazine  (COMPAZINE ) 10 MG tablet Take 1 tablet (10 mg total) by mouth every 6 (six) hours as needed for nausea or vomiting. (Patient not taking: Reported on 04/12/2024) 30 tablet 1   No current facility-administered medications for this visit.   Facility-Administered Medications Ordered in Other Visits  Medication Dose Route Frequency Provider Last Rate Last Admin   Chlorhexidine  Gluconate Cloth 2 % PADS 6 each  6 each Topical Once Toth, Paul III, MD       And   Chlorhexidine  Gluconate Cloth 2 % PADS 6 each  6 each Topical Once Curvin Mt III, MD        Medication reconciliation is based on the patient's most recent medication list in the electronic medical record (EMR) including herbal products and OTC medications.   The patient's medication list was reviewed today with the patient? Yes   Drug-drug interactions (DDIs) DDIs were evaluated? Yes Significant DDIs identified? No   Drug-Food Interactions Drug-food interactions were evaluated? Yes Drug-food interactions identified? Avoid grapefruit products  Follow-up Plan  Treatment start date: 04/18/24 Port placement date: 04/14/24 ECHO date: 04/06/24 We reviewed the prescriptions, premedications, and treatment regimen with the patient. Possible side effects of the treatment regimen were reviewed and management strategies were discussed.  Can use over-the-counter (OTC) options of loperamide  (Imodium) as needed for diarrhea, loratadine (Claritin) as needed for G-CSF bone pain, and docusate + senna (Senna-S) as needed for constipation.  Clinical pharmacy will assist Dr. Vinay Gudena and Tashina Jowett on an as needed basis going forward  [name] participated in the discussion, expressed understanding, and voiced agreement with the above plan. All questions were answered to her satisfaction. The patient was advised to contact the clinic at [phone] with any questions or concerns prior to her return visit.   I spent 60 minutes assessing the patient.  Willetta York A. Lucila, PharmD, BCOP, CPP  Norleen DELENA Lucila, RPH-CPP, 04/12/2024 9:54 AM  **Disclaimer: This note was dictated with voice recognition software. Similar  sounding words can inadvertently be transcribed and this note may contain transcription errors which may not have been corrected upon publication of note.**

## 2024-04-07 NOTE — Progress Notes (Signed)
 REFERRING PROVIDER: Curvin Deward MOULD, MD [address] [place],  [place] [address]  PRIMARY PROVIDER:  [name], MD  PRIMARY REASON FOR VISIT:  1. Family history of prostate cancer   2. Family history of ovarian cancer   3. 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 female, was seen for a [facility] cancer genetics consultation at the request of Dr. Curvin due to a personal and family history of cancer.  Christy Wiley presents to clinic today to discuss the possibility of a hereditary predisposition to cancer, genetic testing, and to further clarify her future cancer risks, as well as potential cancer risks for family members.   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 The treatment plan includes chemotherapy, lumpectomy and radiation.    CANCER HISTORY:  Oncology History  Malignant neoplasm of overlapping sites of right breast in female, estrogen receptor negative (HCC)  03/28/2024 Initial Diagnosis   Screening mammogram detected right breast asymmetry, 2 lesions 1.9 cm at 10:00 and 0.7 cm at the 11:00: Biopsy: 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%   04/05/2024 Cancer Staging   Staging form: Breast, AJCC 8th Edition - Clinical: Stage IIB (cT1c, cN1, cM0, G3, ER-, PR-, HER2-) - Signed by Odean Potts, MD on 04/05/2024 Stage prefix: Initial diagnosis Histologic grading system: 3 grade system   04/19/2024 -  Chemotherapy   Patient is on Treatment Plan : BREAST Pembrolizumab (200) D1 + Carboplatin (1.5) D1,8,15 + Paclitaxel (80) D1,8,15 q21d X 4 cycles / Pembrolizumab (200) D1 + AC D1 q21d x 4 cycles       Past Medical History:  Diagnosis Date   Diabetes (HCC)    Family history of ovarian cancer    Family history of prostate cancer     Past Surgical History:   Procedure Laterality Date   BREAST BIOPSY Right 03/28/2024   US  RT BREAST BX W LOC DEV 1ST LESION IMG BX SPEC US  GUIDE 03/28/2024 [facility]   BREAST BIOPSY Right 03/28/2024   US  RT BREAST BX W LOC DEV EA ADD LESION IMG BX SPEC US  GUIDE 03/28/2024 [facility]   PARTIAL HYSTERECTOMY      Social History   Socioeconomic History   Marital status: Divorced    Spouse name: Not on file   Number of children: Not on file   Years of education: Not on file   Highest education level: Not on file  Occupational History   Not on file  Tobacco Use   Smoking status: Former    Current packs/day: 0.00    Types: Cigarettes    Quit date: 01/1996    Years since quitting: 28.2   Smokeless tobacco: Not on file  Substance and Sexual Activity   Alcohol  use: Never   Drug use: Never   Sexual activity: Not on file  Other Topics Concern   Not on file  Social History Narrative   Not on file   Social Drivers of Health   Financial Resource Strain: Not on file  Food Insecurity: No Food Insecurity (04/05/2024)   Hunger Vital Sign    Worried About Running Out of Food in the Last Year: Never true    Ran Out of Food in the Last Year: Never true  Transportation Needs:  No Transportation Needs (04/05/2024)   PRAPARE - Administrator, Civil Service (Medical): No    Lack of Transportation (Non-Medical): No  Physical Activity: Not on file  Stress: Not on file  Social Connections: Not on file     FAMILY HISTORY:  We obtained a detailed, 4-generation family history.  Significant diagnoses are listed below: Family History  Problem Relation Age of Onset   Prostate cancer Brother    Ovarian cancer Maternal Aunt    Cervical cancer Maternal Aunt    Breast cancer Neg Hx      The patient does not have children. She has seven brothers and a sister.  Four brothers had prostate cancer.  The only other cancer in the family reports is ovarian and cervical cancer in a maternal aunt.  Ms.  Wiley is unaware of previous family history of genetic testing for hereditary cancer risks. There is no reported Ashkenazi Jewish ancestry. There is no known consanguinity.  GENETIC COUNSELING ASSESSMENT: Christy Wiley is a 73 y.o. female with a personal and family history of cancer which is somewhat suggestive of a hereditary cancer syndrome and predisposition to cancer given her triple negative status and the combination of cancer in the family. We, therefore, discussed and recommended the following at today's visit.   DISCUSSION: We discussed that, in general, most cancer is not inherited in families, but instead is sporadic or familial. Sporadic cancers occur by chance and typically happen at older ages (>50 years) as this type of cancer is caused by genetic changes acquired during an individual's lifetime. Some families have more cancers than would be expected by chance; however, the ages or types of cancer are not consistent with a known genetic mutation or known genetic mutations have been ruled out. This type of familial cancer is thought to be due to a combination of multiple genetic, environmental, hormonal, and lifestyle factors. While this combination of factors likely increases the risk of cancer, the exact source of this risk is not currently identifiable or testable.  We discussed that 5 - 10% of breast cancer is hereditary, with most cases associated with BRCA mutations.  There are other genes that can be associated with hereditary breast cancer syndromes.  These include ATM, CHEK2 and PALB2.  Individuals with PALB2 mutations and BRCA mutations are at higher risk for triple negative breast cancers..  We discussed that testing is beneficial for several reasons including knowing how to follow individuals after completing their treatment, identifying whether potential treatment options such as PARP inhibitors would be beneficial, and understand if other family members could be at risk for cancer  and allow them to undergo genetic testing.   We reviewed the characteristics, features and inheritance patterns of hereditary cancer syndromes. We also discussed genetic testing, including the appropriate family members to test, the process of testing, insurance coverage and turn-around-time for results. We discussed the implications of a negative, positive, carrier and/or variant of uncertain significant result. Christy Wiley  was offered a common hereditary cancer panel (36+ genes) and an expanded pan-cancer panel (70+ genes). Christy Wiley was informed of the benefits and limitations of each panel, including that expanded pan-cancer panels contain genes that do not have clear management guidelines at this point in time.  We also discussed that as the number of genes included on a panel increases, the chances of variants of uncertain significance increases. Christy Wiley decided to pursue genetic testing for the CancerNext-Expanded+RNAinsight gene panel.   The CancerNext-Expanded gene panel  offered by [employer] and includes sequencing, rearrangement, and RNA analysis for the following 77 genes: AIP, ALK, APC, ATM, BAP1, BARD1, BMPR1A, BRCA1, BRCA2, BRIP1, CDC73, CDH1, CDK4, CDKN1B, CDKN2A, CEBPA, CHEK2, CTNNA1, DDX41, DICER1, ETV6, FH, FLCN, GATA2, LZTR1, MAX, MBD4, MEN1, MET, MLH1, MSH2, MSH3, MSH6, MUTYH, NF1, NF2, NTHL1, PALB2, PHOX2B, PMS2, POT1, PRKAR1A, PTCH1, PTEN, RAD51C, RAD51D, RB1, RET, RPS20, RUNX1, SDHA, SDHAF2, SDHB, SDHC, SDHD, SMAD4, SMARCA4, SMARCB1, SMARCE1, STK11, SUFU, TMEM127, TP53, TSC1, TSC2, VHL, and WT1 (sequencing and deletion/duplication); AXIN2, CTNNA1, DDX41, EGFR, HOXB13, KIT, MBD4, MITF, MSH3, PDGFRA, POLD1 and POLE (sequencing only); EPCAM and GREM1 (deletion/duplication only). RNA data is routinely analyzed for use in variant interpretation for all genes.   Based on Christy Wiley's personal and family history of cancer, she meets medical criteria for genetic testing. Despite  that she meets criteria, she may still have an out of pocket cost. We discussed that if her out of pocket cost for testing is over $100, the laboratory will call and confirm whether she wants to proceed with testing.  If the out of pocket cost of testing is less than $100 she will be billed by the genetic testing laboratory.   PLAN: After considering the risks, benefits, and limitations, Christy Wiley provided informed consent to pursue genetic testing and the blood sample was sent to [facility] for analysis of the CancerNext-Expanded+RNAinsight. Results should be available within approximately 2-3 weeks' time, at which point they will be disclosed by telephone to Christy Wiley, as will any additional recommendations warranted by these results. Christy Wiley will receive a summary of her genetic counseling visit and a copy of her results once available. This information will also be available in Epic.   Lastly, we encouraged Christy Wiley to remain in contact with cancer genetics annually so that we can continuously update the family history and inform her of any changes in cancer genetics and testing that may be of benefit for this family.   Christy Wiley questions were answered to her satisfaction today. Our contact information was provided should additional questions or concerns arise. Thank you for the referral and allowing us  to share in the care of your patient.   Ladeja Pelham P. Perri, MS, CGC Licensed, Patent attorney [email or web address] phone: [phone]  In total, 35 minutes were spent on the date of the encounter in service to the patient including preparation, face-to-face consultation, documentation and care coordination.  The patient was seen alone.  Drs. Lanny Stalls, and/or Gudena were available for questions, if needed..    _______________________________________________________________________ For Office Wiley:  Number of people involved in session: 1 Was  an Intern/ student involved with case: no

## 2024-04-10 ENCOUNTER — Telehealth: Payer: Self-pay | Admitting: Hematology and Oncology

## 2024-04-10 ENCOUNTER — Encounter (HOSPITAL_BASED_OUTPATIENT_CLINIC_OR_DEPARTMENT_OTHER): Payer: Self-pay | Admitting: General Surgery

## 2024-04-10 ENCOUNTER — Other Ambulatory Visit: Payer: Self-pay

## 2024-04-10 NOTE — Telephone Encounter (Signed)
 Scheduled appointments per WQ. Talked with the patient and she is aware of the made appointments.

## 2024-04-11 ENCOUNTER — Encounter (HOSPITAL_BASED_OUTPATIENT_CLINIC_OR_DEPARTMENT_OTHER)
Admission: RE | Admit: 2024-04-11 | Discharge: 2024-04-11 | Disposition: A | Discharge status: Home / Self Care | Source: Ambulatory Visit | Attending: General Surgery | Admitting: General Surgery

## 2024-04-11 ENCOUNTER — Other Ambulatory Visit: Payer: Self-pay

## 2024-04-11 ENCOUNTER — Telehealth: Payer: Self-pay | Admitting: *Deleted

## 2024-04-11 ENCOUNTER — Encounter: Payer: Self-pay | Admitting: Hematology and Oncology

## 2024-04-11 DIAGNOSIS — Z01810 Encounter for preprocedural cardiovascular examination: Secondary | ICD-10-CM | POA: Insufficient documentation

## 2024-04-11 MED ORDER — CHLORHEXIDINE GLUCONATE CLOTH 2 % EX PADS: 6.0000 | MEDICATED_PAD | Freq: Once | CUTANEOUS | Status: DC

## 2024-04-11 NOTE — Telephone Encounter (Signed)
 Medication Prior Authorization Status  Processed CoverMyMeds KEY: [id number] for Lidocaine -Prilocaine  2.5-2.5% cream  Approved Today  Per [employer] [place] Medicare Electronic   PA Case ID:  #: [id number], Rx #: [id number]   Effective 04/11/2024 through 04/11/2025.SABRA

## 2024-04-11 NOTE — Telephone Encounter (Signed)
 Spoke with pt this afternoon to reschedule follow up phone call. Her schedule is packed on Wednesday so she agreed that a follow up phone call on Thursday after 11:30am would be best. Pt is in agreement with date and time.   [name], RN, BSN Clinical Research Nurse [phone] 04/11/2024

## 2024-04-11 NOTE — Progress Notes (Signed)

## 2024-04-11 NOTE — Telephone Encounter (Signed)
 [employer] 2021-05 - Specimen Collection Study to Evaluate Biomarkers in Subjects with Cancer    Spoke with pt this morning as a follow up from breast clinic last week regarding the above study. Pt stated she has not had a chance to read the consent forms for the study yet. She will read them today and stated that she can see us  tomorrow after her chemo education. Pt asked if this will cost her anything. Informed her that it is of no cost to her and the study does not bill her insurance. She did not have any more questions. We will follow up with pt tomorrow after her chemo education.   [name], RN, BSN Clinical Research Nurse [phone] 04/11/2024

## 2024-04-11 NOTE — Telephone Encounter (Signed)
 [name], ICE COMPRESS: RANDOMIZED TRIAL OF LIMB CRYOCOMPRESSION VERSUS CONTINUOUS COMPRESSION VERSUS LOW CYCLIC COMPRESSION FOR THE PREVENTION  OF TAXANE-INDUCED PERIPHERAL NEUROPATHY   Spoke with pt this morning as a follow up from breast clinic last week regarding the above study. Pt stated she has not had a chance to read the consent forms for the study yet. She will read them today and stated that she can see us  tomorrow after her chemo education. Pt asked if this will cost her anything. Informed her that it is of no cost to her and the study does not bill her insurance. She asked if this will add additional time to her treatment. Informed her that she will be on the device 30 minutes before her Taxol infusion, 1 hr during her Taxol infusion, and 30 mins after her Taxol infusion. She verbalized understanding. She did not have any more questions. We will follow up with pt tomorrow after her chemo education.    [name], RN, BSN Clinical Research Nurse [phone] 04/11/2024

## 2024-04-12 ENCOUNTER — Inpatient Hospital Stay

## 2024-04-12 ENCOUNTER — Encounter (HOSPITAL_COMMUNITY): Payer: Self-pay

## 2024-04-12 ENCOUNTER — Ambulatory Visit (HOSPITAL_COMMUNITY)
Admission: RE | Admit: 2024-04-12 | Discharge: 2024-04-12 | Disposition: A | Discharge status: Home / Self Care | Source: Ambulatory Visit | Attending: Hematology and Oncology | Admitting: Hematology and Oncology

## 2024-04-12 ENCOUNTER — Other Ambulatory Visit: Payer: Self-pay

## 2024-04-12 ENCOUNTER — Inpatient Hospital Stay: Admitting: Pharmacist

## 2024-04-12 ENCOUNTER — Other Ambulatory Visit: Payer: Self-pay | Admitting: Hematology and Oncology

## 2024-04-12 ENCOUNTER — Encounter: Payer: Self-pay | Admitting: Hematology and Oncology

## 2024-04-12 VITALS — BP 157/82 | HR 82 | Temp 97.3°F | Resp 16 | Wt 145.0 lb

## 2024-04-12 DIAGNOSIS — Z171 Estrogen receptor negative status [ER-]: Secondary | ICD-10-CM | POA: Diagnosis present

## 2024-04-12 DIAGNOSIS — C50811 Malignant neoplasm of overlapping sites of right female breast: Secondary | ICD-10-CM | POA: Insufficient documentation

## 2024-04-12 DIAGNOSIS — D0511 Intraductal carcinoma in situ of right breast: Secondary | ICD-10-CM | POA: Diagnosis not present

## 2024-04-12 MED ORDER — IOHEXOL 300 MG/ML  SOLN
100.0000 mL | Freq: Once | INTRAMUSCULAR | Status: AC | PRN
  Administered 2024-04-12: 100 mL via INTRAVENOUS

## 2024-04-12 MED ORDER — TECHNETIUM TC 99M MEDRONATE IV KIT
22.0000 | PACK | Freq: Once | INTRAVENOUS | Status: AC
  Administered 2024-04-12: 22 via INTRAVENOUS

## 2024-04-13 ENCOUNTER — Telehealth: Payer: Self-pay | Admitting: *Deleted

## 2024-04-13 ENCOUNTER — Ambulatory Visit
Admission: RE | Admit: 2024-04-13 | Discharge: 2024-04-13 | Disposition: A | Discharge status: Home / Self Care | Source: Ambulatory Visit | Attending: Hematology and Oncology | Admitting: Hematology and Oncology

## 2024-04-13 ENCOUNTER — Encounter: Payer: Self-pay | Admitting: *Deleted

## 2024-04-13 ENCOUNTER — Other Ambulatory Visit: Payer: Self-pay

## 2024-04-13 ENCOUNTER — Encounter: Payer: Self-pay | Admitting: Hematology and Oncology

## 2024-04-13 DIAGNOSIS — Z171 Estrogen receptor negative status [ER-]: Secondary | ICD-10-CM

## 2024-04-13 MED ORDER — GADOPICLENOL 0.5 MMOL/ML IV SOLN
6.0000 mL | Freq: Once | INTRAVENOUS | Status: AC | PRN
  Administered 2024-04-13: 6 mL via INTRAVENOUS

## 2024-04-13 NOTE — Telephone Encounter (Signed)
 Called to check in with patient to see if she had any questions post [facility] clinic on 9/3. Left naviagator's phone number to call back if she has any needs.

## 2024-04-13 NOTE — Telephone Encounter (Signed)
 [employer] 2021-05 - Specimen Collection Study to Evaluate Biomarkers in Subjects with Cancer    Spoke with pt this afternoon to follow up on interest for above research study. Pt stated she would like to participate in the study. Scheduled an appointment for 04/17/24 to consent pt and informed her we can draw blood on 04/18/24 when she comes in for her infusion. Pt is in agreement and did not have any questions.   [name], RN, BSN Clinical Research Nurse [phone] 04/13/2024

## 2024-04-13 NOTE — Telephone Encounter (Signed)
 [name], ICE COMPRESS: RANDOMIZED TRIAL OF LIMB CRYOCOMPRESSION VERSUS CONTINUOUS COMPRESSION VERSUS LOW CYCLIC COMPRESSION FOR THE PREVENTION  OF TAXANE-INDUCED PERIPHERAL NEUROPATHY   Spoke with pt this afternoon to follow up on interest for above research study. Pt stated she would like to participate in the study. Scheduled an appointment for 04/17/24 at 10am to consent pt, do baseline assessments, and PROs. Pt made aware of appropriate clothing to wear for assessments. Asked pt if she has any history or currently has any peripheral neuropathy and pt stated she does not have a history of diagnosis of neuropathy and does not currently have neuropathy. She did not have any more questions.   [name], RN, BSN Clinical Research Nurse [phone] 04/13/2024

## 2024-04-14 ENCOUNTER — Encounter (HOSPITAL_BASED_OUTPATIENT_CLINIC_OR_DEPARTMENT_OTHER): Payer: Self-pay | Admitting: General Surgery

## 2024-04-14 ENCOUNTER — Ambulatory Visit (HOSPITAL_BASED_OUTPATIENT_CLINIC_OR_DEPARTMENT_OTHER)
Admission: RE | Admit: 2024-04-14 | Discharge: 2024-04-14 | Disposition: A | Discharge status: Home / Self Care | Attending: General Surgery | Admitting: General Surgery

## 2024-04-14 ENCOUNTER — Telehealth: Payer: Self-pay | Admitting: *Deleted

## 2024-04-14 ENCOUNTER — Ambulatory Visit (HOSPITAL_COMMUNITY)

## 2024-04-14 ENCOUNTER — Encounter (HOSPITAL_BASED_OUTPATIENT_CLINIC_OR_DEPARTMENT_OTHER)
Admission: RE | Disposition: A | Discharge status: Home / Self Care | Payer: Self-pay | Source: Home / Self Care | Attending: General Surgery

## 2024-04-14 ENCOUNTER — Other Ambulatory Visit: Payer: Self-pay

## 2024-04-14 ENCOUNTER — Ambulatory Visit (HOSPITAL_BASED_OUTPATIENT_CLINIC_OR_DEPARTMENT_OTHER): Admitting: Certified Registered"

## 2024-04-14 DIAGNOSIS — Z17421 Hormone receptor negative with human epidermal growth factor receptor 2 negative status: Secondary | ICD-10-CM | POA: Diagnosis not present

## 2024-04-14 DIAGNOSIS — E119 Type 2 diabetes mellitus without complications: Secondary | ICD-10-CM | POA: Diagnosis not present

## 2024-04-14 DIAGNOSIS — Z01818 Encounter for other preprocedural examination: Secondary | ICD-10-CM

## 2024-04-14 DIAGNOSIS — C50911 Malignant neoplasm of unspecified site of right female breast: Secondary | ICD-10-CM

## 2024-04-14 DIAGNOSIS — Z87891 Personal history of nicotine dependence: Secondary | ICD-10-CM | POA: Insufficient documentation

## 2024-04-14 DIAGNOSIS — Z7984 Long term (current) use of oral hypoglycemic drugs: Secondary | ICD-10-CM | POA: Diagnosis not present

## 2024-04-14 DIAGNOSIS — Z419 Encounter for procedure for purposes other than remedying health state, unspecified: Secondary | ICD-10-CM

## 2024-04-14 HISTORY — PX: PORTACATH PLACEMENT: SHX2246

## 2024-04-14 LAB — GLUCOSE, CAPILLARY
Glucose-Capillary: 103 mg/dL — ABNORMAL HIGH (ref 70–99)
Glucose-Capillary: 119 mg/dL — ABNORMAL HIGH (ref 70–99)
Glucose-Capillary: 126 mg/dL — ABNORMAL HIGH (ref 70–99)

## 2024-04-14 SURGERY — INSERTION, TUNNELED CENTRAL VENOUS DEVICE, WITH PORT
Anesthesia: General | Site: Chest | Laterality: Left

## 2024-04-14 MED ORDER — HEPARIN SOD (PORK) LOCK FLUSH 100 UNIT/ML IV SOLN
INTRAVENOUS | Status: DC | PRN
  Administered 2024-04-14: 500 [IU] via INTRAVENOUS

## 2024-04-14 MED ORDER — MIDAZOLAM HCL 5 MG/5ML IJ SOLN
INTRAMUSCULAR | Status: DC | PRN
  Administered 2024-04-14: 2 mg via INTRAVENOUS

## 2024-04-14 MED ORDER — PHENYLEPHRINE HCL-NACL 20-0.9 MG/250ML-% IV SOLN
INTRAVENOUS | Status: DC | PRN
  Administered 2024-04-14: 50 ug/min via INTRAVENOUS

## 2024-04-14 MED ORDER — OXYCODONE HCL 5 MG PO TABS: 5.0000 mg | ORAL_TABLET | Freq: Four times a day (QID) | ORAL | 0 refills | Status: AC | PRN

## 2024-04-14 MED ORDER — VANCOMYCIN HCL IN DEXTROSE 1-5 GM/200ML-% IV SOLN
1000.0000 mg | INTRAVENOUS | Status: AC
  Administered 2024-04-14: 1000 mg via INTRAVENOUS

## 2024-04-14 MED ORDER — DEXAMETHASONE SODIUM PHOSPHATE 10 MG/ML IJ SOLN
INTRAMUSCULAR | Status: AC
  Filled 2024-04-14: qty 1

## 2024-04-14 MED ORDER — VANCOMYCIN HCL IN DEXTROSE 1-5 GM/200ML-% IV SOLN
INTRAVENOUS | Status: AC
  Filled 2024-04-14: qty 200

## 2024-04-14 MED ORDER — AMISULPRIDE (ANTIEMETIC) 5 MG/2ML IV SOLN: 10.0000 mg | Freq: Once | INTRAVENOUS | Status: DC | PRN

## 2024-04-14 MED ORDER — LIDOCAINE HCL (CARDIAC) PF 100 MG/5ML IV SOSY
PREFILLED_SYRINGE | INTRAVENOUS | Status: DC | PRN
  Administered 2024-04-14: 80 mg via INTRAVENOUS

## 2024-04-14 MED ORDER — SODIUM CHLORIDE 0.9 % IV SOLN: 12.5000 mg | INTRAVENOUS | Status: DC | PRN

## 2024-04-14 MED ORDER — PHENYLEPHRINE HCL (PRESSORS) 10 MG/ML IV SOLN
INTRAVENOUS | Status: DC | PRN
  Administered 2024-04-14: 80 ug via INTRAVENOUS
  Administered 2024-04-14 (×2): 160 ug via INTRAVENOUS
  Administered 2024-04-14: 80 ug via INTRAVENOUS

## 2024-04-14 MED ORDER — FENTANYL CITRATE (PF) 100 MCG/2ML IJ SOLN
INTRAMUSCULAR | Status: AC
  Filled 2024-04-14: qty 2

## 2024-04-14 MED ORDER — LACTATED RINGERS IV SOLN: INTRAVENOUS | Status: DC

## 2024-04-14 MED ORDER — FENTANYL CITRATE (PF) 100 MCG/2ML IJ SOLN
INTRAMUSCULAR | Status: DC | PRN
  Administered 2024-04-14: 100 ug via INTRAVENOUS

## 2024-04-14 MED ORDER — BUPIVACAINE-EPINEPHRINE 0.25% -1:200000 IJ SOLN
INTRAMUSCULAR | Status: DC | PRN
  Administered 2024-04-14: 8 mL

## 2024-04-14 MED ORDER — MIDAZOLAM HCL 2 MG/2ML IJ SOLN
INTRAMUSCULAR | Status: AC
  Filled 2024-04-14: qty 2

## 2024-04-14 MED ORDER — HEPARIN (PORCINE) IN NACL 2-0.9 UNITS/ML
INTRAMUSCULAR | Status: AC | PRN
  Administered 2024-04-14: 1 via INTRAVENOUS

## 2024-04-14 MED ORDER — ONDANSETRON HCL 4 MG/2ML IJ SOLN
INTRAMUSCULAR | Status: AC
  Filled 2024-04-14: qty 2

## 2024-04-14 MED ORDER — HYDROMORPHONE HCL 1 MG/ML IJ SOLN: 0.2500 mg | INTRAMUSCULAR | Status: DC | PRN

## 2024-04-14 MED ORDER — DEXAMETHASONE SODIUM PHOSPHATE 4 MG/ML IJ SOLN
INTRAMUSCULAR | Status: DC | PRN
  Administered 2024-04-14: 5 mg via INTRAVENOUS

## 2024-04-14 MED ORDER — OXYCODONE HCL 5 MG PO TABS: 5.0000 mg | ORAL_TABLET | Freq: Once | ORAL | Status: DC | PRN

## 2024-04-14 MED ORDER — PROPOFOL 10 MG/ML IV BOLUS
INTRAVENOUS | Status: DC | PRN
Start: 2024-04-14 — End: 2024-04-14
  Administered 2024-04-14: 200 mg via INTRAVENOUS

## 2024-04-14 MED ORDER — PROPOFOL 10 MG/ML IV BOLUS
INTRAVENOUS | Status: AC
  Filled 2024-04-14: qty 20

## 2024-04-14 MED ORDER — ONDANSETRON HCL 4 MG/2ML IJ SOLN
INTRAMUSCULAR | Status: DC | PRN
  Administered 2024-04-14: 4 mg via INTRAVENOUS

## 2024-04-14 MED ORDER — OXYCODONE HCL 5 MG/5ML PO SOLN: 5.0000 mg | Freq: Once | ORAL | Status: DC | PRN

## 2024-04-14 SURGICAL SUPPLY — 37 items
BAG DECANTER FOR FLEXI CONT (MISCELLANEOUS) ×1 IMPLANT
BLADE SURG 15 STRL LF DISP TIS (BLADE) ×1 IMPLANT
CANISTER SUCT 1200ML W/VALVE (MISCELLANEOUS) IMPLANT
CHLORAPREP W/TINT 26 (MISCELLANEOUS) ×1 IMPLANT
CLEANER CAUTERY TIP PAD (MISCELLANEOUS) ×1 IMPLANT
COVER BACK TABLE 60X90IN (DRAPES) ×1 IMPLANT
COVER MAYO STAND STRL (DRAPES) ×1 IMPLANT
DERMABOND ADVANCED .7 DNX12 (GAUZE/BANDAGES/DRESSINGS) ×1 IMPLANT
DRAPE C-ARM 42X72 X-RAY (DRAPES) ×1 IMPLANT
DRAPE LAPAROSCOPIC ABDOMINAL (DRAPES) ×1 IMPLANT
DRAPE UTILITY XL STRL (DRAPES) ×1 IMPLANT
ELECTRODE REM PT RTRN 9FT ADLT (ELECTROSURGICAL) ×1 IMPLANT
GAUZE 4X4 16PLY ~~LOC~~+RFID DBL (SPONGE) ×1 IMPLANT
GLOVE BIO SURGEON STRL SZ7.5 (GLOVE) ×1 IMPLANT
GOWN STRL REUS W/ TWL LRG LVL3 (GOWN DISPOSABLE) ×2 IMPLANT
KIT PORT POWER 8FR ISP CVUE (Port) IMPLANT
NDL HYPO 22X1.5 SAFETY MO (MISCELLANEOUS) IMPLANT
NDL HYPO 25X1 1.5 SAFETY (NEEDLE) ×1 IMPLANT
NDL SAFETY ECLIPSE 18X1.5 (NEEDLE) IMPLANT
NDL SPNL 22GX3.5 QUINCKE BK (NEEDLE) IMPLANT
NEEDLE HYPO 22X1.5 SAFETY MO (MISCELLANEOUS) IMPLANT
NEEDLE HYPO 25X1 1.5 SAFETY (NEEDLE) ×1 IMPLANT
NEEDLE SPNL 22GX3.5 QUINCKE BK (NEEDLE) IMPLANT
PACK BASIN DAY SURGERY FS (CUSTOM PROCEDURE TRAY) ×1 IMPLANT
PENCIL SMOKE EVACUATOR (MISCELLANEOUS) ×1 IMPLANT
SLEEVE SCD COMPRESS KNEE MED (STOCKING) IMPLANT
SPIKE FLUID TRANSFER (MISCELLANEOUS) IMPLANT
SUT MON AB 4-0 PC3 18 (SUTURE) ×1 IMPLANT
SUT PROLENE 2 0 SH DA (SUTURE) ×1 IMPLANT
SUT SILK 2 0 TIES 17X18 (SUTURE) IMPLANT
SUT VIC AB 3-0 SH 27X BRD (SUTURE) ×1 IMPLANT
SYR 10ML LL (SYRINGE) IMPLANT
SYR 5ML LL (SYRINGE) ×1 IMPLANT
SYR CONTROL 10ML LL (SYRINGE) ×2 IMPLANT
TOWEL GREEN STERILE FF (TOWEL DISPOSABLE) ×2 IMPLANT
TUBE CONNECTING 20X1/4 (TUBING) IMPLANT
YANKAUER SUCT BULB TIP NO VENT (SUCTIONS) IMPLANT

## 2024-04-14 NOTE — H&P (Signed)
 REFERRING PHYSICIAN: Gudena, Vinay K, MD PROVIDER: DEWARD GARNETTE NULL, MD MRN: [id number] DOB: 1950-10-14 Subjective   Chief Complaint: Breast Cancer  History of Present Illness: Christy Wiley is a 73 y.o. female who is seen today as an office consultation for evaluation of Breast Cancer  We are asked to see the patient in consultation by Dr. Odean to evaluate her for a new right breast cancer. The patient is a 73 year old black female who recently went for a routine screening mammogram. At that time she was found to have 2 masses in the upper outer quadrant of the right breast measuring 1.9 cm and 7 mm. She also had 2 abnormal axillary lymph nodes and 1 abnormal subpectoral node. 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 She does have hypertension and diabetes but does not smoke.  Review of Systems: A complete review of systems was obtained from the patient. I have reviewed this information and discussed as appropriate with the patient. See HPI as well for other ROS.  ROS   Medical History: Past Medical History:  Diagnosis Date  Arthritis  Diabetes mellitus without complication (CMS/HHS-HCC)  History of cancer  Hyperlipidemia   Patient Active Problem List  Diagnosis  Malignant neoplasm of upper-outer quadrant of right breast in female, estrogen receptor negative (CMS/HHS-HCC)   Past Surgical History:  Procedure Laterality Date  PERCUTANEOUS BIOPSY BREAST    No Known Allergies  Current Outpatient Medications on File Prior to Visit  Medication Sig Dispense Refill  atorvastatin (LIPITOR) 40 MG tablet Take 40 mg by mouth once daily  FARXIGA 5 mg tablet Take 5 mg by mouth once daily  lisinopriL (ZESTRIL) 5 MG tablet Take 5 mg by mouth once daily   No current facility-administered medications on file prior to visit.   Family History  Problem Relation Age of Onset  Diabetes Mother   Stroke Mother  High blood pressure (Hypertension) Father  Deep vein thrombosis (DVT or abnormal blood clot formation) Brother  Diabetes Brother  High blood pressure (Hypertension) Brother  Stroke Brother    Social History   Tobacco Use  Smoking Status Former  Types: Cigarettes  Smokeless Tobacco Never    Social History   Socioeconomic History  Marital status: Divorced  Tobacco Use  Smoking status: Former  Types: Cigarettes  Smokeless tobacco: Never   Objective:  There were no vitals filed for this visit.  There is no height or weight on file to calculate BMI.  Physical Exam Vitals reviewed.  Constitutional:  General: She is not in acute distress. Appearance: Normal appearance.  HENT:  Head: Normocephalic and atraumatic.  Right Ear: External ear normal.  Left Ear: External ear normal.  Nose: Nose normal.  Mouth/Throat:  Mouth: Mucous membranes are moist.  Pharynx: Oropharynx is clear.  Eyes:  General: No scleral icterus. Extraocular Movements: Extraocular movements intact.  Conjunctiva/sclera: Conjunctivae normal.  Pupils: Pupils are equal, round, and reactive to light.  Cardiovascular:  Rate and Rhythm: Normal rate and regular rhythm.  Pulses: Normal pulses.  Heart sounds: Normal heart sounds.  Pulmonary:  Effort: Pulmonary effort is normal. No respiratory distress.  Breath sounds: Normal breath sounds.  Abdominal:  General: Bowel sounds are normal.  Palpations: Abdomen is soft.  Tenderness: There is no abdominal tenderness.  Musculoskeletal:  General: No swelling, tenderness or deformity. Normal range of motion.  Cervical back: Normal range of motion and neck  supple.  Skin: General: Skin is warm and dry.  Coloration: Skin is not jaundiced.  Neurological:  General: No focal deficit present.  Mental Status: She is alert and oriented to person, place, and time.  Psychiatric:  Mood and Affect: Mood normal.  Behavior: Behavior normal.     Breast:  There is no palpable mass in either breast. There is a palpable mobile lymph node in the right axilla  Labs, Imaging and Diagnostic Testing:  Assessment and Plan:   Diagnoses and all orders for this visit:  Malignant neoplasm of upper-outer quadrant of right breast in female, estrogen receptor negative (CMS/HHS-HCC) - [facility] Case Posting Request; Future   The patient appears to have a locally advanced cancer in the upper outer quadrant of the right breast with 2 positive lymph nodes and a positive subpectoral lymph node. Because the cancer is triple negative she would likely benefit from neoadjuvant therapy to try to shrink the cancer and downstage the axilla. I have discussed with her in detail the risks and benefits of the operation to place a port as well as some of the technical aspects including the risk of pneumothorax and she understands and wishes to proceed. We will follow her as she goes to be ready for her definitive surgery once she nears the end of her chemotherapy.

## 2024-04-14 NOTE — Anesthesia Preprocedure Evaluation (Addendum)
 Anesthesia Evaluation  Patient identified by MRN, date of birth, ID band Patient awake    Reviewed: Allergy & Precautions, H&P , NPO status , Patient's Chart, lab work & pertinent test results  Airway Mallampati: II  TM Distance: >3 FB Neck ROM: Full    Dental  (+) Dental Advisory Given   Pulmonary neg pulmonary ROS, former smoker   Pulmonary exam normal breath sounds clear to auscultation       Cardiovascular negative cardio ROS Normal cardiovascular exam Rhythm:Regular Rate:Normal     Neuro/Psych negative neurological ROS  negative psych ROS   GI/Hepatic negative GI ROS, Neg liver ROS,,,  Endo/Other  negative endocrine ROSdiabetes, Type 2    Renal/GU negative Renal ROS  negative genitourinary   Musculoskeletal negative musculoskeletal ROS (+)    Abdominal   Peds negative pediatric ROS (+)  Hematology negative hematology ROS (+)   Anesthesia Other Findings Breast Cancer  Reproductive/Obstetrics negative OB ROS                              Anesthesia Physical Anesthesia Plan  ASA: 3  Anesthesia Plan: General   Post-op Pain Management:    Induction: Intravenous  PONV Risk Score and Plan: 3 and Ondansetron , Dexamethasone , Midazolam  and Treatment may vary due to age or medical condition  Airway Management Planned: LMA  Additional Equipment:   Intra-op Plan:   Post-operative Plan: Extubation in OR  Informed Consent: I have reviewed the patients History and Physical, chart, labs and discussed the procedure including the risks, benefits and alternatives for the proposed anesthesia with the patient or authorized representative who has indicated his/her understanding and acceptance.     Dental advisory given  Plan Discussed with: CRNA  Anesthesia Plan Comments:         Anesthesia Quick Evaluation

## 2024-04-14 NOTE — Discharge Instructions (Signed)

## 2024-04-14 NOTE — Anesthesia Procedure Notes (Signed)
 Procedure Name: LMA Insertion Date/Time: 04/14/2024 11:37 AM  Performed by: Burnard Rosaline HERO, CRNAPre-anesthesia Checklist: Patient identified, Emergency Drugs available, Suction available and Patient being monitored Patient Re-evaluated:Patient Re-evaluated prior to induction Oxygen Delivery Method: Circle system utilized Preoxygenation: Pre-oxygenation with 100% oxygen Induction Type: IV induction Ventilation: Mask ventilation without difficulty LMA: LMA inserted LMA Size: 4.0 Number of attempts: 1 Airway Equipment and Method: Bite block Placement Confirmation: positive ETCO2, breath sounds checked- equal and bilateral and CO2 detector Tube secured with: Tape Dental Injury: Teeth and Oropharynx as per pre-operative assessment

## 2024-04-14 NOTE — Interval H&P Note (Signed)
 History and Physical Interval Note:  04/14/2024 10:44 AM  Christy Wiley  has presented today for surgery, with the diagnosis of RIGHT BREAST CANCER.  The various methods of treatment have been discussed with the patient and family. After consideration of risks, benefits and other options for treatment, the patient has consented to  Procedure(s) with comments: INSERTION, TUNNELED CENTRAL VENOUS DEVICE, WITH PORT (N/A) - PORT PLACEMENT WITH ULTRASOUND GUIDANCE as a surgical intervention.  The patient's history has been reviewed, patient examined, no change in status, stable for surgery.  I have reviewed the patient's chart and labs.  Questions were answered to the patient's satisfaction.     Deward Null III

## 2024-04-14 NOTE — Telephone Encounter (Signed)
 Discussed results of her MRI and per Dr. Curvin would need other bx if wanting lump.  Patient is not sure what she wants to do and is having a hard time grasping this information. She would like to talk to Dr. Curvin before making a decision and I have sent a message for him to call her.

## 2024-04-14 NOTE — Transfer of Care (Signed)
 Immediate Anesthesia Transfer of Care Note  Patient: Christy Wiley  Procedure(s) Performed: INSERTION, TUNNELED CENTRAL VENOUS DEVICE, WITH PORT (Left: Chest)  Patient Location: PACU  Anesthesia Type:General  Level of Consciousness: awake, drowsy, and patient cooperative  Airway & Oxygen Therapy: Patient Spontanous Breathing and Patient connected to face mask oxygen  Post-op Assessment: Report given to RN and Post -op Vital signs reviewed and stable  Post vital signs: Reviewed and stable  Last Vitals:  Vitals Value Taken Time  BP 163/83 04/14/24 12:30  Temp    Pulse 82 04/14/24 12:31  Resp 15 04/14/24 12:31  SpO2 100 % 04/14/24 12:31  Vitals shown include unfiled device data.  Last Pain:  Vitals:   04/14/24 1021  TempSrc: Tympanic  PainSc: 2       Patients Stated Pain Goal: 6 (04/14/24 1021)  Complications: No notable events documented.

## 2024-04-14 NOTE — Op Note (Signed)
 04/14/2024  12:22 PM  PATIENT:  Christy Wiley  73 y.o. female  PRE-OPERATIVE DIAGNOSIS:  RIGHT BREAST CANCER  POST-OPERATIVE DIAGNOSIS:  RIGHT BREAST CANCER  PROCEDURE:  Procedure(s) with comments: INSERTION, TUNNELED CENTRAL VENOUS DEVICE,  PORT (Left)   SURGEON:  Surgeons and Role:    * Curvin Deward MOULD, MD - Primary  PHYSICIAN ASSISTANT:   ASSISTANTS: none   ANESTHESIA:   local and general  EBL:  15 mL   BLOOD ADMINISTERED:none  DRAINS: none   LOCAL MEDICATIONS USED:  MARCAINE      SPECIMEN:  No Specimen  DISPOSITION OF SPECIMEN:  N/A  COUNTS:  YES  TOURNIQUET:  * No tourniquets in log *  DICTATION: .Dragon Dictation  After informed consent was obtained the patient was brought to the operating room and placed in the supine position on the operating table.  After adequate induction of general anesthesia a roll was placed between the patient's shoulder blades to extend the shoulder slightly.  The left chest and neck area were then prepped with ChloraPrep, allowed to dry, and draped in usual sterile manner.  An appropriate timeout was performed.  The patient was placed in Trendelenburg position.  The area lateral to the bend of the clavicle on the left chest wall was infiltrated with quarter percent Marcaine .  A large bore needle from the Port-A-Cath kit was used to slide beneath the bend of the clavicle heading towards the sternal notch and in doing so I was able to access the left subclavian vein without difficulty.  Wire was fed through the needle using the Seldinger technique without difficulty.  The wire was confirmed in the central venous system using real-time fluoroscopy.  Next a small incision was made at the wire entry site with a 15 blade knife.  The incision was carried through the skin and subcutaneous tissue sharply with the electrocautery.  A subcutaneous pocket was created inferior to the incision by blunt finger dissection.  Next the tubing was placed on the  reservoir.  The reservoir was placed in the pocket and the length of the tubing was estimated using real-time fluoroscopy.  The tubing was cut to the appropriate length.  Next a sheath and dilator were fed over the wire using the Seldinger technique without difficulty.  The dilator and wire were removed from the patient.  The tubing was fed through the sheath as far as it would go and then held in place while the sheath was gently cracked and separated.  Another real-time fluoroscopy image showed the tip of the catheter to be in the distal superior vena cava.  The tubing was then permanently anchored to the reservoir.  The reservoir was anchored in the pocket with two 2-0 Prolene stitches.  The port was then aspirated and it aspirated blood easily.  The port was then flushed initially with a dilute heparin  solution and then with a more concentrated heparin  solution.  The subcutaneous tissue was closed over the port with interrupted 3-0 Vicryl stitches.  The skin was closed with a running 4-0 Monocryl subcuticular stitch.  Dermabond dressings were applied.  The patient tolerated the procedure well.  At the end of the case all needle sponge and instrument counts were correct.  The patient was then awakened and taken to recovery in stable condition.  PLAN OF CARE: Discharge to home after PACU  PATIENT DISPOSITION:  PACU - hemodynamically stable.   Delay start of Pharmacological VTE agent (>24hrs) due to surgical blood loss or risk of  bleeding: not applicable

## 2024-04-17 ENCOUNTER — Other Ambulatory Visit: Payer: Self-pay | Admitting: *Deleted

## 2024-04-17 ENCOUNTER — Encounter (HOSPITAL_BASED_OUTPATIENT_CLINIC_OR_DEPARTMENT_OTHER): Payer: Self-pay | Admitting: General Surgery

## 2024-04-17 ENCOUNTER — Encounter: Admitting: *Deleted

## 2024-04-17 ENCOUNTER — Encounter: Payer: Self-pay | Admitting: Hematology and Oncology

## 2024-04-17 ENCOUNTER — Encounter: Payer: Self-pay | Admitting: *Deleted

## 2024-04-17 ENCOUNTER — Other Ambulatory Visit: Payer: Self-pay

## 2024-04-17 DIAGNOSIS — C50811 Malignant neoplasm of overlapping sites of right female breast: Secondary | ICD-10-CM

## 2024-04-17 NOTE — Research (Signed)
 [employer] 2021-05 - Specimen Collection Study to Evaluate Biomarkers in Subjects with Cancer     [name] was identified by Dr.Gudena as a potential candidate for the above listed study.  This Clinical Research Nurse met with Christy Wiley, [id number] on 04/17/24 in a manner and location that ensures patient privacy to discuss participation in the above listed research study.  Patient is Unaccompanied.  Patient was previously provided with informed consent documents.  Patient has not yet read the informed consent documents and so documents were reviewed page by page today.  As outlined in the informed consent form, this Nurse and [name] discussed the purpose of the research study, the investigational nature of the study, study procedures and requirements for study participation, potential risks and benefits of study participation, as well as alternatives to participation.  This study is not blinded or double-blinded. The patient understands participation is voluntary and they may withdraw from study participation at any time.  This study does not involve randomization.  This study does not involve an investigational drug or device. This study does not involve a placebo. Patient understands enrollment is pending full eligibility review.   Confidentiality and how the patient's information will be used as part of study participation were discussed.  Patient was informed there is reimbursement provided for their time and effort spent on trial participation.  The patient is encouraged to discuss research study participation with their insurance provider to determine what costs they may incur as part of study participation, including research related injury.    All questions were answered to patient's satisfaction.  The informed consent with embedded HIPAA language was reviewed page by page.  The patient's mental and emotional status is appropriate to provide informed consent, and  the patient verbalizes an understanding of study participation.  Patient has agreed to participate in the above listed research study and has voluntarily signed the informed consent version [employer]/Protocol number 2021-05, [employer] Approved Version 31 Dec 2023 with embedded HIPAA language, version [employer]/Protocol number 2021-05, [employer] Approved Version 31 Dec 2023  on 04/17/24 at 10AM.  The patient was provided with a copy of the signed informed consent form with embedded HIPAA language for their reference.  No study specific procedures were obtained prior to the signing of the informed consent document.  Approximately 30 minutes were spent with the patient reviewing the informed consent documents.  Patient was not requested to complete a Release of Information form.   Eligibility: Eligibility criteria reviewed with patient. This nurse/coordinator has reviewed this patient's inclusion and exclusion criteria and confirmed patient is eligible for study participation. Eligibility confirmed by treating investigator, who also agrees that patient should proceed with enrollment. Patient will continue with enrollment.  Medical History:  This RN/Coordinator reviewed the medical history as reported in the patient's medical record with the participant.  In addition, the participant was asked to report any new medical conditions not previously recorded on the medical history form.   Was the current medical history form correct?   Yes Are there any new medical conditions to report?  No  Based on the review of the medical chart and the patient's responses, all reportable medical history events will be entered for study reporting purposes.    Data Collection: Patient was interviewed to collect the following information.  Medical History:  High Blood Pressure  No Coronary Artery Disease No Lupus    No Rheumatoid Arthritis  No Diabetes   Yes  If yes, which type?      2 Lynch  Syndrome  No  Is the patient currently taking a magnesium supplement?   No  Does the patient have a personal history of cancer (greater than 5 years ago)?  No  Does the patient have a family history of cancer in 1st or 2nd degree relatives? Yes If yes, Relationship(s) and Cancer type(s)?   Maternal aunt ovarian cancer Paternal uncle lung cancer Paternal uncle unknown cancer Brother protstate cancer Brother prostate cancer  Does the patient have history of alcohol  consumption? No    Does the patient have history of cigarette, cigar, pipe, or chewing tobacco use?  No   Pt has agreed to have her blood drawn on C1D1 before treatment. Patient was thanked for their participation in this study.   [name], RN, BSN Clinical Research Nurse [phone] 04/17/2024

## 2024-04-17 NOTE — Research (Signed)
 [employer] 2021-05 - Specimen Collection Study to Evaluate Biomarkers in Subjects with Cancer    This Coordinator has reviewed this patient's inclusion and exclusion criteria as a second review and confirms patient is eligible for study participation. Patient may continue with enrollment.

## 2024-04-17 NOTE — Research (Unsigned)
 S2205, ICE COMPRESS: RANDOMIZED TRIAL OF LIMB CRYOCOMPRESSION VERSUS CONTINUOUS COMPRESSION VERSUS LOW CYCLIC COMPRESSION FOR THE PREVENTION OF TAXANE-INDUCED PERIPHERAL NEUROPATHY    [name] was identified by Dr.Gudena as a potential candidate for the above listed study.  This Clinical Research Nurse met with Christy Wiley, [id number] on 04/17/24 in a manner and location that ensures patient privacy to discuss participation in the above listed research study.  Patient is Unaccompanied.  Patient was previously provided with informed consent documents.  Patient has not yet read the informed consent documents and so documents were reviewed page by page today.  As outlined in the informed consent form, this Nurse and [name] discussed the purpose of the research study, the investigational nature of the study, study procedures and requirements for study participation, potential risks and benefits of study participation, as well as alternatives to participation.  This study is not blinded or double-blinded. The patient understands participation is voluntary and they may withdraw from study participation at any time.  Each study arm was reviewed, and randomization discussed.  Potential side effects were reviewed with patient as outlined in the consent form, and patient made aware there may be side effects not yet known. This study does not involve a placebo. Patient understands enrollment is pending full eligibility review.   Confidentiality and how the patient's information will be used as part of study participation were discussed.  Patient was informed there is not reimbursement provided for their time and effort spent on trial participation.  The patient is encouraged to discuss research study participation with their insurance provider to determine what costs they may incur as part of study participation, including research related injury.    All questions were answered to patient's  satisfaction.  The informed consent and separate HIPAA Authorization was reviewed page by page.  The patient's mental and emotional status is appropriate to provide informed consent, and the patient verbalizes an understanding of study participation.  Patient has agreed to participate in the above listed research study and has voluntarily signed the informed consent version Protocol Version Date 11/23/23 [place] Active Date: 01/20/2024 and separate HIPAA Authorization, version 10/16/21 [place] approved date 06/17/2023 on 04/17/24 at 10AM.  The patient was provided with a copy of the signed informed consent form and separate HIPAA Authorization for their reference.  No study specific procedures were obtained prior to the signing of the informed consent document.  Approximately 60 minutes were spent with the patient reviewing the informed consent documents.  Patient was not requested to complete a Release of Information form.   [name], ICE COMPRESS: RANDOMIZED TRIAL OF LIMB CRYOCOMPRESSION VERSUS CONTINUOUS COMPRESSION VERSUS LOW CYCLIC COMPRESSION FOR THE PREVENTION OF TAXANE-INDUCED PERIPHERAL NEUROPATHY  Clarified the following with the patient/provider:  Does the patient have a history of skin or limb metastases? No Has the patient previously received neurotoxic chemotherapy for any reason (e.g. taxanes, platinum agents, vinca alkaloids, or bortezomib)? No Does the patient have pre-existing clinical peripheral neuropathy from any cause? No Doest the patient have a history of: Raynaud's phenomenon? No Cold agglutinin disease? No Cryoglobulinemia? No Cryofibrinogenemia? No Post-traumatic cold dystrophy? No Peripheral arterial ischemia? No  She confirms that she does not have any open skin wounds, ulcers of the limbs or any other changes. Baseline PROs were completed prior to any other study interventions. Neuropathy assessment was completed by this RN with assistant from Laury Quale, clinical  research coordinator with timing with neuropathy assessments. Plan for randomization within 3 calendar  days prior to initiation of chemotherapy. Reviewed clothing recommendations and provided patient information hand-out. Patient verbalized understanding of the investigational nature of this study.  Upon assessment pt mentioned that about 3-4 weeks ago she started experiencing mild intermittent numbness in the tips of her fingers in her left hand. She stated it started when she started experiencing pain in her left neck and arm. She attributes this to her mild arthritis. Per Dr.Gudena the numbness in her finger tips is mild paresthesia and not peripheral sensory neuropathy and pt may proceed with enrollment into clinical study. In reviewing the consent with pt she did not feel comfortable with the section of the insurance may or may not pay for injuries caused by the study device. She stated she will call her insurance company and inquire about this. Despite this she did want us  to enroll her in the above clinical trial. This nurse has reviewed the patient's inclusion and exclusion criteria and confirmed Christy Wiley is eligible for study participation. Eligibility confirmed by 2 research nurses. Treating investigator also agrees that patient is eligible for enrollment into S2205 study. Patient will continue with enrollment and did not have any additional questions at this time.   Christy Larsen, RN, BSN Clinical Research Nurse [phone] 04/17/2024

## 2024-04-17 NOTE — Research (Signed)
 S2205, ICE COMPRESS: RANDOMIZED TRIAL OF LIMB CRYOCOMPRESSION VERSUS CONTINUOUS COMPRESSION VERSUS LOW CYCLIC COMPRESSION FOR THE PREVENTION  OF TAXANE-INDUCED PERIPHERAL NEUROPATHY  This Nurse has reviewed this patient's inclusion and exclusion criteria as a second review and confirms Christy Wiley is eligible for study participation.  Patient may continue with enrollment.  Delon Pinal BSN RN Clinical Research Nurse [facility] Direct Dial: [phone] 04/17/2024  1:54 PM

## 2024-04-18 ENCOUNTER — Encounter: Payer: Self-pay | Admitting: Hematology and Oncology

## 2024-04-18 ENCOUNTER — Encounter: Payer: Self-pay | Admitting: *Deleted

## 2024-04-18 ENCOUNTER — Other Ambulatory Visit: Payer: Self-pay | Admitting: *Deleted

## 2024-04-18 ENCOUNTER — Inpatient Hospital Stay

## 2024-04-18 ENCOUNTER — Inpatient Hospital Stay (HOSPITAL_BASED_OUTPATIENT_CLINIC_OR_DEPARTMENT_OTHER): Admitting: Hematology and Oncology

## 2024-04-18 VITALS — BP 164/88 | HR 84 | Resp 18

## 2024-04-18 VITALS — BP 120/80 | HR 72 | Temp 97.8°F | Resp 18 | Ht 63.0 in | Wt 143.1 lb

## 2024-04-18 DIAGNOSIS — C50811 Malignant neoplasm of overlapping sites of right female breast: Secondary | ICD-10-CM

## 2024-04-18 DIAGNOSIS — N838 Other noninflammatory disorders of ovary, fallopian tube and broad ligament: Secondary | ICD-10-CM

## 2024-04-18 DIAGNOSIS — Z171 Estrogen receptor negative status [ER-]: Secondary | ICD-10-CM

## 2024-04-18 DIAGNOSIS — D0511 Intraductal carcinoma in situ of right breast: Secondary | ICD-10-CM | POA: Diagnosis not present

## 2024-04-18 DIAGNOSIS — R928 Other abnormal and inconclusive findings on diagnostic imaging of breast: Secondary | ICD-10-CM

## 2024-04-18 LAB — CBC WITH DIFFERENTIAL (CANCER CENTER ONLY)
Abs Immature Granulocytes: 0 K/uL (ref 0.00–0.07)
Basophils Absolute: 0 K/uL (ref 0.0–0.1)
Basophils Relative: 1 %
Eosinophils Absolute: 0.1 K/uL (ref 0.0–0.5)
Eosinophils Relative: 2 %
HCT: 39.7 % (ref 36.0–46.0)
Hemoglobin: 13.2 g/dL (ref 12.0–15.0)
Immature Granulocytes: 0 %
Lymphocytes Relative: 40 %
Lymphs Abs: 2 K/uL (ref 0.7–4.0)
MCH: 28.6 pg (ref 26.0–34.0)
MCHC: 33.2 g/dL (ref 30.0–36.0)
MCV: 86.1 fL (ref 80.0–100.0)
Monocytes Absolute: 0.4 K/uL (ref 0.1–1.0)
Monocytes Relative: 8 %
Neutro Abs: 2.4 K/uL (ref 1.7–7.7)
Neutrophils Relative %: 49 %
Platelet Count: 258 K/uL (ref 150–400)
RBC: 4.61 MIL/uL (ref 3.87–5.11)
RDW: 12.5 % (ref 11.5–15.5)
WBC Count: 4.9 K/uL (ref 4.0–10.5)
nRBC: 0 % (ref 0.0–0.2)

## 2024-04-18 LAB — CMP (CANCER CENTER ONLY)
ALT: 8 U/L (ref 0–44)
AST: 12 U/L — ABNORMAL LOW (ref 15–41)
Albumin: 4.1 g/dL (ref 3.5–5.0)
Alkaline Phosphatase: 61 U/L (ref 38–126)
Anion gap: 4 — ABNORMAL LOW (ref 5–15)
BUN: 11 mg/dL (ref 8–23)
CO2: 27 mmol/L (ref 22–32)
Calcium: 8.9 mg/dL (ref 8.9–10.3)
Chloride: 108 mmol/L (ref 98–111)
Creatinine: 0.61 mg/dL (ref 0.44–1.00)
GFR, Estimated: >60 mL/min (ref >60)
Glucose, Bld: 123 mg/dL — ABNORMAL HIGH (ref 70–99)
Potassium: 3.8 mmol/L (ref 3.5–5.1)
Sodium: 139 mmol/L (ref 135–145)
Total Bilirubin: 0.4 mg/dL (ref 0.0–1.2)
Total Protein: 7.1 g/dL (ref 6.5–8.1)

## 2024-04-18 LAB — TSH: TSH: 0.359 u[IU]/mL (ref 0.350–4.500)

## 2024-04-18 MED ORDER — SODIUM CHLORIDE 0.9 % IV SOLN
117.9000 mg | Freq: Once | INTRAVENOUS | Status: AC
  Administered 2024-04-18: 120 mg via INTRAVENOUS
  Filled 2024-04-18: qty 12

## 2024-04-18 MED ORDER — SODIUM CHLORIDE 0.9 % IV SOLN
80.0000 mg/m2 | Freq: Once | INTRAVENOUS | Status: AC
  Administered 2024-04-18: 138 mg via INTRAVENOUS
  Filled 2024-04-18: qty 23

## 2024-04-18 MED ORDER — DIPHENHYDRAMINE HCL 50 MG/ML IJ SOLN
50.0000 mg | Freq: Once | INTRAMUSCULAR | Status: AC
  Administered 2024-04-18: 50 mg via INTRAVENOUS
  Filled 2024-04-18: qty 1

## 2024-04-18 MED ORDER — SODIUM CHLORIDE 0.9 % IV SOLN
200.0000 mg | Freq: Once | INTRAVENOUS | Status: AC
  Administered 2024-04-18: 200 mg via INTRAVENOUS
  Filled 2024-04-18: qty 200

## 2024-04-18 MED ORDER — DEXAMETHASONE SODIUM PHOSPHATE 10 MG/ML IJ SOLN
10.0000 mg | Freq: Once | INTRAMUSCULAR | Status: AC
  Administered 2024-04-18: 10 mg via INTRAVENOUS
  Filled 2024-04-18: qty 1

## 2024-04-18 MED ORDER — PALONOSETRON HCL INJECTION 0.25 MG/5ML
0.2500 mg | Freq: Once | INTRAVENOUS | Status: AC
  Administered 2024-04-18: 0.25 mg via INTRAVENOUS
  Filled 2024-04-18: qty 5

## 2024-04-18 MED ORDER — FAMOTIDINE IN NACL 20-0.9 MG/50ML-% IV SOLN
20.0000 mg | Freq: Once | INTRAVENOUS | Status: AC
  Administered 2024-04-18: 20 mg via INTRAVENOUS
  Filled 2024-04-18: qty 50

## 2024-04-18 MED ORDER — SODIUM CHLORIDE 0.9 % IV SOLN: INTRAVENOUS | Status: DC

## 2024-04-18 NOTE — Assessment & Plan Note (Addendum)
 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%   CT CAP 04/12/2024: Right breast mass and prominent right axillary lymph nodes, calcified omental nodules (probably granulomatous disease) peritoneal carcinomatosis not excluded.,  Left ovary lesion 3.8 cm Bone scan 04/13/2024: 2 foci of increased uptake in the cervical spine region indeterminate recommend MRI spine Breast MRI 04/13/2024: Right breast non-mass enhancement 9 cm, 3 abnormal right axillary lymph nodes  Treatment plan: Neoadjuvant chemotherapy with Taxol  carbo Keytruda  weekly x 12 followed by Adriamycin Cytoxan Keytruda  followed by Keytruda  maintenance Breast conserving surgery with ALND Adjuvant radiation therapy ----------------------------------------------------------------------------------------------------------------------------------------- Radiology discussion: Cervical spine needs to be evaluated by an MRI Ovarian adnexal lesion will need to be evaluated by pelvic MRI GYN evaluation  Current treatment: Cycle 1 Taxol  carbo Keytruda  (start 04/19/2024) participation in S2205 clinical trial  Labs reviewed, chemo education completed, chemo consent obtained, antiemetics were reviewed Return to clinic in 1 week for toxicity check

## 2024-04-18 NOTE — Research (Unsigned)
 [name], ICE COMPRESS: RANDOMIZED TRIAL OF LIMB CRYOCOMPRESSION VERSUS CONTINUOUS COMPRESSION VERSUS LOW CYCLIC COMPRESSION FOR THE PREVENTION OF TAXANE-INDUCED PERIPHERAL NEUROPATHY    Patient arrives today unaccompanied for the C1D1 treatment. Confirmed patient does not have wounds, sores, or lesions to extremities. Patient has not had any vaccinations since last visit.   LABS: Optional labs are collected via port per consent and study protocol: [name] tolerated well without complaint.   MD/PROVIDER VISIT: Patient sees Dr.Gudena for today's visit.   ADVERSE EVENTS: Solicited AEs reviewed with patient.  She denies any sensory or motor neuropathy symptoms. See AE table below   Baseline AEs Adverse Event CTCAE Grade Onset date Resolved date Relationship to Study Intervention Action Taken Comments  Skin atrophy (solicited) 0            Skin hyperpigmentation (solicited) 0            Skin hypopigmentation (solicited) 0            Skin induration (solicited) 0            Skin ulceration (solicited) 0            Rash maculopapular (solicited) 0            Nail changes (solicited) 0            Cold intolerance (solicited) (general disorders and administration site conditions- other) 0            Frostbite (solicited) (skin and subcutaneous tissue disorders- other) 0                STUDY INTERVENTION & TOLERABILITY ASSESSMENTS: 11:50 pm am Device wraps applied; pre-treatment started set on Arm 1 CyroCompression with temperature setting at 11 degree celsius and cyclic pressure 5-15 mm Hg.  12:00 pm Required 5-15 minute Tolerability check- pt states tolerable to cyrocompressionwith temperature setting at 11 degree celsius and cyclic pressure 5-15 mm Hg. 12:35 pm - 12:47pm Bathroom break 12 mins. 12:47 pm Moved to treatment phase. Cyrocompression with temperature setting at 11 degree celsius and cyclic pressure 5-15 mm Hg. 12:50 pm Required 60 minutes tolerability check. Pt states  tolerable to cyrocompression with temperature setting at 11 degree celsius and cyclic pressure 5-15 mm Hg. 8:49 pm Required 60 minutes tolerability check. Pt states tolerable to cyrocompression with temperature setting at 11 degree celsius and cyclic pressure 5-15 mm Hg. 7:70 pm Taxane infusion end time. Moved to post-treatment phase. 2:29 pm - 2:40 pm Bathroom break 11 mins. 3:10 pm Wraps removed. Confirmed no new wounds, sores, or lesions.   Was the treatment paused for a bathroom break? Yes   The patient was thanked for their time and continued voluntary participation in this study. She understands research nurse will meet her again at next scheduled taxane infusion 04/25/24  to apply study device. [name] has been provided direct contact information and is encouraged to contact this Nurse for any needs or question.   [name], RN, BSN Clinical Research Nurse [phone] 04/18/2024

## 2024-04-18 NOTE — Research (Signed)
 [name], ICE COMPRESS: RANDOMIZED TRIAL OF LIMB CRYOCOMPRESSION VERSUS CONTINUOUS COMPRESSION VERSUS LOW CYCLIC COMPRESSION FOR THE PREVENTION  OF TAXANE-INDUCED PERIPHERAL NEUROPATHY    [name], was registered to the above listed study, assigned study [phone]. Randomization is required for this study. Randomization information to follow.       [name] is randomized to Arm 1 Cyrocompression.  Stratification criteria were confirmed by this clinical research Nurse. This clinical research Nurse and Delon Pinal, RN verified the stratification factors used for randomization.  As part of the assigned treatment arm, [name] will receive Cryocompression during her Taxol  treatment. MD has been notified of patient assignment; treatment will begin today 04/18/2024.  Per study protocol, Pt has been notified of her randomization result. [name] is successfully enrolled in the above study. Pt's optional baseline blood samples will be obtained today prior to treatment.    Christy Larsen, RN, BSN Clinical Research Nurse [phone] 04/18/2024

## 2024-04-18 NOTE — Patient Instructions (Signed)
 [facility] - A DEPT OF [facility]  Discharge Instructions: Thank you for choosing [facility] to provide your oncology and hematology care.   If you have a lab appointment with the Cancer Center, please go directly to the Cancer Center and check in at the registration area.   Wear comfortable clothing and clothing appropriate for easy access to any Portacath or PICC line.   We strive to give you quality time with your provider. You may need to reschedule your appointment if you arrive late (15 or more minutes).  Arriving late affects you and other patients whose appointments are after yours.  Also, if you miss three or more appointments without notifying the office, you may be dismissed from the clinic at the provider's discretion.      For prescription refill requests, have your pharmacy contact our office and allow 72 hours for refills to be completed.    Today you received the following chemotherapy and/or immunotherapy agents: Pembrolizumab  (Keytruda ), Paclitaxel  (Taxol ), Carboplatin  (Carbo).      To help prevent nausea and vomiting after your treatment, we encourage you to take your nausea medication as directed.  BELOW ARE SYMPTOMS THAT SHOULD BE REPORTED IMMEDIATELY: *FEVER GREATER THAN 100.4 F (38 C) OR HIGHER *CHILLS OR SWEATING *NAUSEA AND VOMITING THAT IS NOT CONTROLLED WITH YOUR NAUSEA MEDICATION *UNUSUAL SHORTNESS OF BREATH *UNUSUAL BRUISING OR BLEEDING *URINARY PROBLEMS (pain or burning when urinating, or frequent urination) *BOWEL PROBLEMS (unusual diarrhea, constipation, pain near the anus) TENDERNESS IN MOUTH AND THROAT WITH OR WITHOUT PRESENCE OF ULCERS (sore throat, sores in mouth, or a toothache) UNUSUAL RASH, SWELLING OR PAIN  UNUSUAL VAGINAL DISCHARGE OR ITCHING   Items with * indicate a potential emergency and should be followed up as soon as possible or go to the Emergency Department if any problems should  occur.  Please show the CHEMOTHERAPY ALERT CARD or IMMUNOTHERAPY ALERT CARD at check-in to the Emergency Department and triage nurse.  Should you have questions after your visit or need to cancel or reschedule your appointment, please contact [facility] - A DEPT OF [facility]  Dept: [phone]  and follow the prompts.  Office hours are 8:00 a.m. to 4:30 p.m. Monday - Friday. Please note that voicemails left after 4:00 p.m. may not be returned until the following business day.  We are closed weekends and major holidays. You have access to a nurse at all times for urgent questions. Please call the main number to the clinic Dept: [phone] and follow the prompts.   For any non-urgent questions, you may also contact your provider using MyChart. We now offer e-Visits for anyone 37 and older to request care online for non-urgent symptoms. For details visit [email or web address].   Also download the MyChart app! Go to the app store, search MyChart, open the app, select [place], and log in with your MyChart username and password.

## 2024-04-18 NOTE — Progress Notes (Signed)
 Per MD request, RN placed urgent referral to [facility] for [facility] at [facility] for evaluation of ovarian mass seen on recent CT.  RN attempt x1 to contact referral coordinator.  No answer, LVM.

## 2024-04-18 NOTE — Progress Notes (Signed)
 Patient Care Team: [name], MD as PCP - General (Family Medicine) Gerome, Devere HERO, RN as Oncology Nurse Navigator Tyree Nanetta SAILOR, RN as Oncology Nurse Navigator Odean Potts, MD as Consulting Physician (Hematology and Oncology) Dewey Rush, MD as Consulting Physician (Radiation Oncology) Curvin Deward MOULD, MD as Consulting Physician (General Surgery)  DIAGNOSIS:  Encounter Diagnosis  Name Primary?   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%   04/05/2024 Cancer Staging   Staging form: Breast, AJCC 8th Edition - Clinical: Stage IIB (cT1c, cN1, cM0, G3, ER-, PR-, HER2-) - Signed by Odean Potts, MD on 04/05/2024 Stage prefix: Initial diagnosis Histologic grading system: 3 grade system   04/19/2024 -  Chemotherapy   Patient is on Treatment Plan : BREAST Pembrolizumab  (200) D1 + Carboplatin  (1.5) D1,8,15 + Paclitaxel  (80) D1,8,15 q21d X 4 cycles / Pembrolizumab  (200) D1 + AC D1 q21d x 4 cycles       CHIEF COMPLIANT: Cycle 1 Taxol  carboplatin   HISTORY OF PRESENT ILLNESS:  History of Present Illness Christy Wiley is a 73 year old female with breast cancer who presents for evaluation of recent imaging findings.  She has a mass in the right breast, confirmed by recent CT and MRI, measuring 1.9 cm on biopsy. She is undergoing chemotherapy. Imaging also shows calcified omental nodules in the abdomen and a 3.8 cm lesion on the left ovary, which is incomplete to characterize. A bone scan reveals two small foci of increased uptake in the posterior cervical spine. She  experiences pain radiating from the neck to the arm, similar to previous leg pain. She has arthritis and was prescribed meloxicam, which she has not taken recently. Her diabetes medication has changed, and she is closely monitoring her blood sugar levels during chemotherapy.     ALLERGIES:  is allergic to penicillin g.  MEDICATIONS:  Current Outpatient Medications  Medication Sig Dispense Refill   atorvastatin (LIPITOR) 40 MG tablet Take 40 mg by mouth.     lisinopril (ZESTRIL) 5 MG tablet Take 5 mg by mouth.     meloxicam (MOBIC) 7.5 MG tablet Take 7.5 mg by mouth daily as needed.     ondansetron  (ZOFRAN ) 8 MG tablet Take 1 tablet (8 mg total) by mouth every 8 (eight) hours as needed for nausea or vomiting. Start on the third day after chemotherapy. 30 tablet 1   oxyCODONE  (ROXICODONE ) 5 MG immediate release tablet Take 1 tablet (5 mg total) by mouth every 6 (six) hours as needed. 10 tablet 0   prochlorperazine  (COMPAZINE ) 10 MG tablet Take 1 tablet (10 mg total) by mouth every 6 (six) hours as needed for nausea or vomiting. 30 tablet 1   Tirzepatide (MOUNJARO [place]) Inject into the skin.     lidocaine -prilocaine  (EMLA ) cream Apply to affected area once (Patient not taking: Reported on 04/18/2024) 30 g 3   No current facility-administered medications for this visit.    PHYSICAL EXAMINATION: ECOG PERFORMANCE STATUS: 1 - Symptomatic but completely ambulatory  Vitals:   04/18/24 0900  BP: 120/80  Pulse: 72  Resp: 18  Temp: 97.8 F (36.6 C)  SpO2: 100%   Filed Weights   04/18/24 0900  Weight: 143 lb 1.6 oz (64.9 kg)    LABORATORY DATA:  I have reviewed the data as listed    Latest Ref Rng & Units 04/18/2024    8:48 AM 04/05/2024    1:04 PM  CMP  Glucose 70 - 99 mg/dL 876  879   BUN 8 - 23 mg/dL 11  14   Creatinine 9.55 - 1.00 mg/dL 9.38  9.31   Sodium 864 - 145 mmol/L 139  139   Potassium 3.5 - 5.1 mmol/L 3.8  4.5   Chloride 98 - 111 mmol/L 108  108   CO2 22 - 32 mmol/L 27   27   Calcium 8.9 - 10.3 mg/dL 8.9  9.5   Total Protein 6.5 - 8.1 g/dL 7.1  7.6   Total Bilirubin 0.0 - 1.2 mg/dL 0.4  0.5   Alkaline Phos 38 - 126 U/L 61  57   AST 15 - 41 U/L 12  13   ALT 0 - 44 U/L 8  12     Lab Results  Component Value Date   WBC 4.9 04/18/2024   HGB 13.2 04/18/2024   HCT 39.7 04/18/2024   MCV 86.1 04/18/2024   PLT 258 04/18/2024   NEUTROABS 2.4 04/18/2024    ASSESSMENT & PLAN:  Malignant neoplasm of overlapping sites of right breast in female, estrogen receptor negative (HCC) 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%   CT CAP 04/12/2024: Right breast mass and prominent right axillary lymph nodes, calcified omental nodules (probably granulomatous disease) peritoneal carcinomatosis not excluded.,  Left ovary lesion 3.8 cm Bone scan 04/13/2024: 2 foci of increased uptake in the cervical spine region indeterminate recommend MRI spine Breast MRI 04/13/2024: Right breast non-mass enhancement 9 cm, 3 abnormal right axillary lymph nodes  Treatment plan: Neoadjuvant chemotherapy with Taxol  carbo Keytruda  weekly x 12 followed by Adriamycin Cytoxan Keytruda  followed by Keytruda  maintenance Breast conserving surgery with ALND Adjuvant radiation therapy ----------------------------------------------------------------------------------------------------------------------------------------- Radiology discussion: Cervical spine needs to be evaluated by an MRI: Ordering a neck MRI Ovarian adnexal lesion will need to be evaluated by the mass itself GYN evaluation: Will send consultation request  Current treatment: Cycle 1 Taxol  carbo Keytruda  (start 04/19/2024) participation in S2205 clinical trial  Labs reviewed, chemo education completed, chemo consent obtained, antiemetics were reviewed Return to clinic in 1 week for toxicity  check ------------------------------------- Assessment and Plan Assessment & Plan Right breast cancer with non-mass enhancement MRI indicates extensive involvement, possibly overestimated. Biopsy needed to confirm disease extent. Tumor shrinkage aimed for lumpectomy. - Proceed with chemotherapy. - Arrange biopsy of non-mass enhancement. - Discuss lumpectomy versus mastectomy.  Indeterminate left ovarian lesion 3.8 cm lesion on CT scan requires further evaluation due to postmenopausal status and malignancy risk. - Order pelvic MRI. - Refer to gynecologist for evaluation and possible ultrasound.  Indeterminate cervical spine lesions Bone scan shows increased uptake; could be arthritis or metastasis. Symptoms suggest nerve compression. - Order MRI of cervical spine. - Consider monitoring if MRI inconclusive.  Omental and pelvic nodules CT scan shows calcified nodules likely benign granulomatous disease.  Type 2 diabetes mellitus Blood glucose well-managed. Monitoring needed during chemotherapy. - Monitor blood glucose during chemotherapy. - Advise adherence to diabetes medication.      No orders of the defined types were placed in  this encounter.  The patient has a good understanding of the overall plan. she agrees with it. she will call with any problems that may develop before the next visit here. Total time spent: 45 mins including face to face time and time spent for planning, charting and co-ordination of care   Naomi MARLA Chad, MD 04/18/24

## 2024-04-19 ENCOUNTER — Encounter: Payer: Self-pay | Admitting: General Practice

## 2024-04-19 ENCOUNTER — Encounter: Payer: Self-pay | Admitting: Hematology and Oncology

## 2024-04-19 LAB — T4: T4, Total: 7.8 ug/dL (ref 4.5–12.0)

## 2024-04-19 NOTE — Progress Notes (Signed)
 [facility] Spiritual Care Note  Followed up with Christy Wiley after meeting in [facility], [place]. She notes that she is doing her best to cope directly with what is at hand, doing her part and then turning the rest over to God. Her faith is a sustaining aspect of her life. She reports good support from family. At this time, her top concern is about finances, and she is still processing, of course, the additional health questions that have arisen since her breast cancer diagnosis.  Christy Fite welcomes continued check-in calls for support and encouragement, particularly because calls help maintain her privacy more than infusion visits might. She also knows to contact chaplain directly whenever needed/desired.  [address] [place], [place], [facility] Pager [phone] Voicemail [phone]

## 2024-04-20 ENCOUNTER — Other Ambulatory Visit: Payer: Self-pay | Admitting: Hematology and Oncology

## 2024-04-20 DIAGNOSIS — R928 Other abnormal and inconclusive findings on diagnostic imaging of breast: Secondary | ICD-10-CM

## 2024-04-20 DIAGNOSIS — Z171 Estrogen receptor negative status [ER-]: Secondary | ICD-10-CM

## 2024-04-24 ENCOUNTER — Telehealth: Payer: Self-pay | Admitting: Genetic Counselor

## 2024-04-24 ENCOUNTER — Ambulatory Visit: Payer: Self-pay | Admitting: Genetic Counselor

## 2024-04-24 ENCOUNTER — Encounter: Payer: Self-pay | Admitting: Genetic Counselor

## 2024-04-24 DIAGNOSIS — Z1379 Encounter for other screening for genetic and chromosomal anomalies: Secondary | ICD-10-CM

## 2024-04-24 DIAGNOSIS — Z171 Estrogen receptor negative status [ER-]: Secondary | ICD-10-CM

## 2024-04-24 NOTE — Progress Notes (Signed)
 HPI:  Ms. Linderman was previously seen in the [facility] due to a personal and family history of cancer and concerns regarding a hereditary predisposition to cancer. Please refer to our prior cancer genetics clinic note for more information regarding our discussion, assessment and recommendations, at the time. Ms. Markert recent genetic test results were disclosed to her, as were recommendations warranted by these results. These results and recommendations are discussed in more detail below.  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  The report date is April 19, 2024.  The CancerNext-Expanded gene panel offered by [facility] and includes sequencing, rearrangement, and RNA analysis for the following 77 genes: AIP, ALK, APC, ATM, BAP1, BARD1, BMPR1A, BRCA1, BRCA2, BRIP1, CDC73, CDH1, CDK4, CDKN1B, CDKN2A, CEBPA, CHEK2, CTNNA1, DDX41, DICER1, ETV6, FH, FLCN, GATA2, LZTR1, MAX, MBD4, MEN1, MET, MLH1, MSH2, MSH3, MSH6, MUTYH, NF1, NF2, NTHL1, PALB2, PHOX2B, PMS2, POT1, PRKAR1A, PTCH1, PTEN, RAD51C, RAD51D, RB1, RET, RPS20, RUNX1,  SDHA, SDHAF2, SDHB, SDHC, SDHD, SMAD4, SMARCA4, SMARCB1, SMARCE1, STK11, SUFU, TMEM127, TP53, TSC1, TSC2, VHL, and WT1 (sequencing and deletion/duplication); AXIN2, CTNNA1, DDX41, EGFR, HOXB13, KIT, MBD4, MITF, MSH3, PDGFRA, POLD1 and POLE (sequencing only); EPCAM and GREM1 (deletion/duplication only). RNA data is routinely analyzed for use in variant interpretation for all genes.      FAMILY HISTORY:  We obtained a detailed, 4-generation family history.  Significant diagnoses are listed below: Family History  Problem Relation Age of Onset   Prostate cancer Brother    Ovarian cancer Maternal Aunt    Cervical cancer Maternal Aunt    Breast cancer Neg Hx        The patient does not have children. She has seven brothers and a sister.  Four brothers had prostate cancer.  The only other cancer in the family reports is ovarian and cervical cancer in a maternal aunt.   Ms. Joa is unaware of previous family history of genetic testing for hereditary cancer risks. There is no reported Ashkenazi Jewish ancestry. There is no known consanguinity.  GENETIC TEST RESULTS: Genetic testing reported out on April 19, 2024 through the CancerNext-Expanded+RNAinsight cancer panel found no pathogenic mutations. The CancerNext-Expanded gene panel offered by [facility] and includes sequencing, rearrangement, and RNA analysis for the following 77 genes: AIP, ALK, APC, ATM, BAP1, BARD1, BMPR1A, BRCA1, BRCA2, BRIP1, CDC73, CDH1, CDK4, CDKN1B, CDKN2A, CEBPA, CHEK2, CTNNA1, DDX41, DICER1, ETV6, FH, FLCN, GATA2, LZTR1, MAX, MBD4, MEN1, MET, MLH1, MSH2, MSH3, MSH6, MUTYH, NF1, NF2, NTHL1, PALB2, PHOX2B, PMS2, POT1, PRKAR1A, PTCH1, PTEN, RAD51C, RAD51D, RB1, RET, RPS20, RUNX1, SDHA, SDHAF2, SDHB, SDHC, SDHD, SMAD4, SMARCA4, SMARCB1, SMARCE1, STK11, SUFU, TMEM127, TP53,  TSC1, TSC2, VHL, and WT1 (sequencing and deletion/duplication); AXIN2, CTNNA1, DDX41, EGFR, HOXB13, KIT, MBD4, MITF, MSH3, PDGFRA, POLD1 and POLE  (sequencing only); EPCAM and GREM1 (deletion/duplication only). RNA data is routinely analyzed for use in variant interpretation for all genes. The test report has been scanned into EPIC and is located under the Molecular Pathology section of the Results Review tab.  A portion of the result report is included below for reference.     We discussed with Ms. Slezak that because current genetic testing is not perfect, it is possible there may be a gene mutation in one of these genes that current testing cannot detect, but that chance is small.  We also discussed, that there could be another gene that has not yet been discovered, or that we have not yet tested, that is responsible for the cancer diagnoses in the family. It is also possible there is a hereditary cause for the cancer in the family that Ms. Wooden did not inherit and therefore was not identified in her testing.  Therefore, it is important to remain in touch with cancer genetics in the future so that we can continue to offer Ms. Fawaz the most up to date genetic testing.  ADDITIONAL GENETIC TESTING: We discussed with Ms. Trinka that her genetic testing was fairly extensive.  If there are genes identified to increase cancer risk that can be analyzed in the future, we would be happy to discuss and coordinate this testing at that time.    CANCER SCREENING RECOMMENDATIONS: Ms. Shepperson test result is considered negative (normal).  This means that we have not identified a hereditary cause for her personal and family history of cancer at this time. Most cancers happen by chance and this negative test suggests that her personal and family history of cancer may fall into this category.    Possible reasons for Ms. Meditz's negative genetic test include:  1. There may be a gene mutation in one of these genes that current testing methods cannot detect but that chance is small.  2. There could be another gene that has not yet been discovered, or that  we have not yet tested, that is responsible for the cancer diagnoses in the family.  3.  There may be no hereditary risk for cancer in the family. The cancers in Ms. Veracruz and/or her family may be sporadic/familial or due to other genetic and environmental factors. 4. It is also possible there is a hereditary cause for the cancer in the family that Ms. Babula did not inherit.  Therefore, it is recommended she continue to follow the cancer management and screening guidelines provided by her oncology and primary healthcare provider. An individual's cancer risk and medical management are not determined by genetic test results alone. Overall cancer risk assessment incorporates additional factors, including personal medical history, family history, and any available genetic information that may result in a personalized plan for cancer prevention and surveillance  RECOMMENDATIONS FOR FAMILY MEMBERS:   Since she did not inherit a identifiable mutation in a cancer predisposition gene included on this panel, her children could not have inherited a known mutation from her in one of these genes. Individuals in this family might be at some increased risk of developing cancer, over the general population risk, simply due to the family history of cancer.  We recommended women in this family have a yearly mammogram beginning at age 27, or 91 years younger than the earliest onset of cancer, an annual clinical breast exam,  and perform monthly breast self-exams. Women in this family should also have a gynecological exam as recommended by their primary provider. All family members should be referred for colonoscopy starting at age 58, or 68 years younger than the earliest onset of cancer.  FOLLOW-UP: Lastly, we discussed with Ms. Kruser that cancer genetics is a rapidly advancing field and it is possible that new genetic tests will be appropriate for her and/or her family members in the future. We encouraged her to  remain in contact with cancer genetics on an annual basis so we can update her personal and family histories and let her know of advances in cancer genetics that may benefit this family.   Our contact number was provided. Ms. Romagnoli questions were answered to her satisfaction, and she knows she is welcome to call us  at anytime with additional questions or concerns.   Darice Monte, MS, [facility] Licensed, Certified Genetic Counselor [email or web address]

## 2024-04-24 NOTE — Telephone Encounter (Signed)
 I contacted  Christy Wiley to discuss her genetic testing results. No pathogenic variants were identified in the 77 genes analyzed. Discussed that we do not know why she has CancerNext-Expanded+RNAinsight cancer or why there is cancer in the family. It could be due to a different gene that we are not testing, or maybe our current technology may not be able to pick something up.  It will be important for her to keep in contact with genetics to keep up with whether additional testing may be needed.Detailed clinic note to follow.   The test report will be scanned into EPIC and will be located under the Molecular Pathology section of the Results Review tab.  A portion of the result report is included below for reference.

## 2024-04-24 NOTE — Anesthesia Postprocedure Evaluation (Addendum)
 Anesthesia Post Note  Patient: Christy Wiley  Procedure(s) Performed: INSERTION, TUNNELED CENTRAL VENOUS DEVICE, WITH PORT (Left: Chest)     Patient location during evaluation: PACU Anesthesia Type: General Level of consciousness: awake and alert Pain management: pain level controlled Vital Signs Assessment: post-procedure vital signs reviewed and stable Respiratory status: spontaneous breathing, nonlabored ventilation and respiratory function stable Cardiovascular status: blood pressure returned to baseline and stable Postop Assessment: no apparent nausea or vomiting Anesthetic complications: no   No notable events documented.  Last Vitals:  Vitals:   04/14/24 1305 04/14/24 1334  BP: (!) 179/96 (!) 116/95  Pulse: 80 75  Resp: (!) 22 18  Temp:  (!) 36.1 C  SpO2: 97% 97%    Last Pain:  Vitals:   04/17/24 0826  TempSrc:   PainSc: 0-No pain                 Butler Levander Pinal

## 2024-04-25 ENCOUNTER — Encounter: Payer: Self-pay | Admitting: *Deleted

## 2024-04-25 ENCOUNTER — Inpatient Hospital Stay (HOSPITAL_BASED_OUTPATIENT_CLINIC_OR_DEPARTMENT_OTHER): Admitting: Hematology and Oncology

## 2024-04-25 ENCOUNTER — Inpatient Hospital Stay

## 2024-04-25 VITALS — BP 139/82 | HR 82 | Temp 97.4°F | Resp 18 | Ht 63.0 in | Wt 144.9 lb

## 2024-04-25 DIAGNOSIS — Z171 Estrogen receptor negative status [ER-]: Secondary | ICD-10-CM

## 2024-04-25 DIAGNOSIS — D0511 Intraductal carcinoma in situ of right breast: Secondary | ICD-10-CM | POA: Diagnosis not present

## 2024-04-25 DIAGNOSIS — C50811 Malignant neoplasm of overlapping sites of right female breast: Secondary | ICD-10-CM | POA: Diagnosis not present

## 2024-04-25 LAB — CMP (CANCER CENTER ONLY)
ALT: 11 U/L (ref 0–44)
AST: 13 U/L — ABNORMAL LOW (ref 15–41)
Albumin: 3.9 g/dL (ref 3.5–5.0)
Alkaline Phosphatase: 51 U/L (ref 38–126)
Anion gap: 4 — ABNORMAL LOW (ref 5–15)
BUN: 13 mg/dL (ref 8–23)
CO2: 27 mmol/L (ref 22–32)
Calcium: 8.6 mg/dL — ABNORMAL LOW (ref 8.9–10.3)
Chloride: 109 mmol/L (ref 98–111)
Creatinine: 0.75 mg/dL (ref 0.44–1.00)
GFR, Estimated: >60 mL/min (ref >60)
Glucose, Bld: 143 mg/dL — ABNORMAL HIGH (ref 70–99)
Potassium: 4.1 mmol/L (ref 3.5–5.1)
Sodium: 140 mmol/L (ref 135–145)
Total Bilirubin: 0.4 mg/dL (ref 0.0–1.2)
Total Protein: 6.7 g/dL (ref 6.5–8.1)

## 2024-04-25 LAB — CBC WITH DIFFERENTIAL (CANCER CENTER ONLY)
Abs Immature Granulocytes: 0.01 K/uL (ref 0.00–0.07)
Basophils Absolute: 0 K/uL (ref 0.0–0.1)
Basophils Relative: 1 %
Eosinophils Absolute: 0.1 K/uL (ref 0.0–0.5)
Eosinophils Relative: 3 %
HCT: 35.2 % — ABNORMAL LOW (ref 36.0–46.0)
Hemoglobin: 11.9 g/dL — ABNORMAL LOW (ref 12.0–15.0)
Immature Granulocytes: 0 %
Lymphocytes Relative: 44 %
Lymphs Abs: 1.5 K/uL (ref 0.7–4.0)
MCH: 29 pg (ref 26.0–34.0)
MCHC: 33.8 g/dL (ref 30.0–36.0)
MCV: 85.9 fL (ref 80.0–100.0)
Monocytes Absolute: 0.2 K/uL (ref 0.1–1.0)
Monocytes Relative: 7 %
Neutro Abs: 1.6 K/uL — ABNORMAL LOW (ref 1.7–7.7)
Neutrophils Relative %: 45 %
Platelet Count: 237 K/uL (ref 150–400)
RBC: 4.1 MIL/uL (ref 3.87–5.11)
RDW: 12.3 % (ref 11.5–15.5)
WBC Count: 3.5 K/uL — ABNORMAL LOW (ref 4.0–10.5)
nRBC: 0 % (ref 0.0–0.2)

## 2024-04-25 MED ORDER — PANTOPRAZOLE SODIUM 40 MG PO TBEC: 40.0000 mg | DELAYED_RELEASE_TABLET | Freq: Every day | ORAL | 3 refills | Status: AC

## 2024-04-25 MED ORDER — DIPHENHYDRAMINE HCL 50 MG/ML IJ SOLN
50.0000 mg | Freq: Once | INTRAMUSCULAR | Status: AC
  Administered 2024-04-25: 50 mg via INTRAVENOUS
  Filled 2024-04-25: qty 1

## 2024-04-25 MED ORDER — PALONOSETRON HCL INJECTION 0.25 MG/5ML
0.2500 mg | Freq: Once | INTRAVENOUS | Status: AC
  Administered 2024-04-25: 0.25 mg via INTRAVENOUS
  Filled 2024-04-25: qty 5

## 2024-04-25 MED ORDER — SODIUM CHLORIDE 0.9 % IV SOLN
117.9000 mg | Freq: Once | INTRAVENOUS | Status: AC
  Administered 2024-04-25: 120 mg via INTRAVENOUS
  Filled 2024-04-25: qty 12

## 2024-04-25 MED ORDER — SODIUM CHLORIDE 0.9% FLUSH
10.0000 mL | INTRAVENOUS | Status: DC | PRN
  Administered 2024-04-25: 10 mL

## 2024-04-25 MED ORDER — DEXAMETHASONE SODIUM PHOSPHATE 10 MG/ML IJ SOLN
10.0000 mg | Freq: Once | INTRAMUSCULAR | Status: AC
  Administered 2024-04-25: 10 mg via INTRAVENOUS
  Filled 2024-04-25: qty 1

## 2024-04-25 MED ORDER — SODIUM CHLORIDE 0.9 % IV SOLN
65.0000 mg/m2 | Freq: Once | INTRAVENOUS | Status: AC
  Administered 2024-04-25: 114 mg via INTRAVENOUS
  Filled 2024-04-25: qty 19

## 2024-04-25 MED ORDER — FAMOTIDINE IN NACL 20-0.9 MG/50ML-% IV SOLN
20.0000 mg | Freq: Once | INTRAVENOUS | Status: AC
  Administered 2024-04-25: 20 mg via INTRAVENOUS
  Filled 2024-04-25: qty 50

## 2024-04-25 MED ORDER — SODIUM CHLORIDE 0.9 % IV SOLN: INTRAVENOUS | Status: DC

## 2024-04-25 NOTE — Patient Instructions (Signed)
 [facility] - A DEPT OF [facility]  Discharge Instructions: Thank you for choosing [facility] to provide your oncology and hematology care.   If you have a lab appointment with the Cancer Center, please go directly to the Cancer Center and check in at the registration area.   Wear comfortable clothing and clothing appropriate for easy access to any Portacath or PICC line.   We strive to give you quality time with your provider. You may need to reschedule your appointment if you arrive late (15 or more minutes).  Arriving late affects you and other patients whose appointments are after yours.  Also, if you miss three or more appointments without notifying the office, you may be dismissed from the clinic at the provider's discretion.      For prescription refill requests, have your pharmacy contact our office and allow 72 hours for refills to be completed.    Today you received the following chemotherapy and/or immunotherapy agents taxol and carboplatin      To help prevent nausea and vomiting after your treatment, we encourage you to take your nausea medication as directed.  BELOW ARE SYMPTOMS THAT SHOULD BE REPORTED IMMEDIATELY: *FEVER GREATER THAN 100.4 F (38 C) OR HIGHER *CHILLS OR SWEATING *NAUSEA AND VOMITING THAT IS NOT CONTROLLED WITH YOUR NAUSEA MEDICATION *UNUSUAL SHORTNESS OF BREATH *UNUSUAL BRUISING OR BLEEDING *URINARY PROBLEMS (pain or burning when urinating, or frequent urination) *BOWEL PROBLEMS (unusual diarrhea, constipation, pain near the anus) TENDERNESS IN MOUTH AND THROAT WITH OR WITHOUT PRESENCE OF ULCERS (sore throat, sores in mouth, or a toothache) UNUSUAL RASH, SWELLING OR PAIN  UNUSUAL VAGINAL DISCHARGE OR ITCHING   Items with * indicate a potential emergency and should be followed up as soon as possible or go to the Emergency Department if any problems should occur.  Please show the CHEMOTHERAPY ALERT CARD or  IMMUNOTHERAPY ALERT CARD at check-in to the Emergency Department and triage nurse.  Should you have questions after your visit or need to cancel or reschedule your appointment, please contact [facility] - A DE[facility]  Dept: [phone]  and follow the prompts.  Office hours are 8:00 a.m. to 4:30 p.m. Monday - Friday. Please note that voicemails left after 4:00 p.m. may not be returned until the following business day.  We are closed weekends and major holidays. You have access to a nurse at all times for urgent questions. Please call the main number to the clinic Dept: [phone] and follow the prompts.   For any non-urgent questions, you may also contact your provider using MyChart. We now offer e-Visits for anyone 62 and older to request care online for non-urgent symptoms. For details visit [email or web address].   Also download the MyChart app! Go to the app store, search "MyChart", open the app, select [place], and log in with your MyChart username and password.

## 2024-04-25 NOTE — Progress Notes (Signed)
 Patient Care Team: [name], MD as PCP - General (Family Medicine) Gerome, Devere HERO, RN as Oncology Nurse Navigator Tyree Nanetta SAILOR, RN as Oncology Nurse Navigator Odean Potts, MD as Consulting Physician (Hematology and Oncology) Dewey Rush, MD as Consulting Physician (Radiation Oncology) Curvin Deward MOULD, MD as Consulting Physician (General Surgery)  DIAGNOSIS:  Encounter Diagnosis  Name Primary?   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  The report date is April 19, 2024.  The CancerNext-Expanded gene panel offered by [facility] and includes sequencing, rearrangement, and RNA analysis for the following 77 genes: AIP, ALK, APC, ATM, BAP1, BARD1, BMPR1A, BRCA1, BRCA2, BRIP1, CDC73, CDH1, CDK4, CDKN1B, CDKN2A, CEBPA, CHEK2, CTNNA1, DDX41, DICER1, ETV6, FH, FLCN, GATA2, LZTR1, MAX, MBD4, MEN1, MET, MLH1, MSH2, MSH3, MSH6, MUTYH, NF1, NF2, NTHL1, PALB2, PHOX2B, PMS2, POT1, PRKAR1A, PTCH1, PTEN, RAD51C, RAD51D, RB1,  RET, RPS20, RUNX1, SDHA, SDHAF2, SDHB, SDHC, SDHD, SMAD4, SMARCA4, SMARCB1, SMARCE1, STK11, SUFU, TMEM127, TP53, TSC1, TSC2, VHL, and WT1 (sequencing and deletion/duplication); AXIN2, CTNNA1, DDX41, EGFR, HOXB13, KIT, MBD4, MITF, MSH3, PDGFRA, POLD1 and POLE (sequencing only); EPCAM and GREM1 (deletion/duplication only). RNA data is routinely analyzed for use in variant interpretation for all genes.      CHIEF COMPLIANT: Cycle 2 Taxol  carboplatin   HISTORY OF PRESENT ILLNESS:   History of Present Illness Christy Wiley is a 73 year old female undergoing chemotherapy who presents for follow-up of her treatment and related symptoms.  She experiences persistent pain in her left arm, primarily in the shoulder extending down to near the elbow. A sensation of constriction from her throat to her chest occurs when lying down, which she associates with acid reflux. She experienced a dry cough last night, similar to the onset of a cold, but no other cold symptoms. Slight fatigue is present but not more than usual.  She is diabetic and sometimes craves sweets, such as ice cream, but is trying to limit her intake. She has not started a formal walking routine but engages in a lot of walking at home.  She is undergoing chemotherapy and had a port placed recently. She experienced a sensation in her throat after the port placement, which she attributes to the tube. Her recent blood work shows slightly low neutrophils.     ALLERGIES:  is allergic to penicillin g.  MEDICATIONS:  Current Outpatient Medications  Medication Sig Dispense Refill   atorvastatin (LIPITOR) 40 MG tablet Take 40 mg by mouth.     lisinopril (ZESTRIL) 5 MG tablet Take 5 mg by  mouth.     meloxicam (MOBIC) 7.5 MG tablet Take 7.5 mg by mouth daily as needed.     ondansetron  (ZOFRAN ) 8 MG tablet Take 1 tablet (8 mg total) by mouth every 8 (eight) hours as needed for nausea or vomiting. Start on the third day after chemotherapy. 30 tablet 1    oxyCODONE  (ROXICODONE ) 5 MG immediate release tablet Take 1 tablet (5 mg total) by mouth every 6 (six) hours as needed. 10 tablet 0   prochlorperazine  (COMPAZINE ) 10 MG tablet Take 1 tablet (10 mg total) by mouth every 6 (six) hours as needed for nausea or vomiting. 30 tablet 1   Tirzepatide (MOUNJARO [place]) Inject into the skin.     lidocaine -prilocaine  (EMLA ) cream Apply to affected area once (Patient not taking: Reported on 04/25/2024) 30 g 3   No current facility-administered medications for this visit.    PHYSICAL EXAMINATION: ECOG PERFORMANCE STATUS: 1 - Symptomatic but completely ambulatory  Vitals:   04/25/24 1152  BP: 139/82  Pulse: 82  Resp: 18  Temp: (!) 97.4 F (36.3 C)  SpO2: 100%   Filed Weights   04/25/24 1152  Weight: 144 lb 14.4 oz (65.7 kg)    Physical Exam   (exam performed in the presence of a chaperone)  LABORATORY DATA:  I have reviewed the data as listed    Latest Ref Rng & Units 04/18/2024    8:48 AM 04/05/2024    1:04 PM  CMP  Glucose 70 - 99 mg/dL 876  879   BUN 8 - 23 mg/dL 11  14   Creatinine 9.55 - 1.00 mg/dL 9.38  9.31   Sodium 864 - 145 mmol/L 139  139   Potassium 3.5 - 5.1 mmol/L 3.8  4.5   Chloride 98 - 111 mmol/L 108  108   CO2 22 - 32 mmol/L 27  27   Calcium 8.9 - 10.3 mg/dL 8.9  9.5   Total Protein 6.5 - 8.1 g/dL 7.1  7.6   Total Bilirubin 0.0 - 1.2 mg/dL 0.4  0.5   Alkaline Phos 38 - 126 U/L 61  57   AST 15 - 41 U/L 12  13   ALT 0 - 44 U/L 8  12     Lab Results  Component Value Date   WBC 3.5 (L) 04/25/2024   HGB 11.9 (L) 04/25/2024   HCT 35.2 (L) 04/25/2024   MCV 85.9 04/25/2024   PLT 237 04/25/2024   NEUTROABS 1.6 (L) 04/25/2024    ASSESSMENT & PLAN:  Malignant neoplasm of overlapping sites of right breast in female, estrogen receptor negative (HCC) 03/28/2024:Screening mammogram detected right breast asymmetry, 2 lesions 1.9 cm at 10:00 and 0.7 cm at the 11:00: Biopsy: Grade 3 IDC with high-grade DCIS triple negative  Ki-67 90%    CT CAP 04/12/2024: Right breast mass and prominent right axillary lymph nodes, calcified omental nodules (probably granulomatous disease) peritoneal carcinomatosis not excluded.,  Left ovary lesion 3.8 cm Bone scan 04/13/2024: 2 foci of increased uptake in the cervical spine region indeterminate recommend MRI spine Breast MRI 04/13/2024: Right breast non-mass enhancement 9 cm, 3 abnormal right axillary lymph nodes   Treatment plan: Neoadjuvant chemotherapy with Taxol  carbo Keytruda  weekly x 12 followed by Adriamycin Cytoxan Keytruda  followed by Keytruda  maintenance Breast conserving surgery with ALND Adjuvant radiation therapy  GYN evaluation for the ovarian adnexal lesion: Pending Cervical spine MRI are pending -------------------------------------------------------------------------------------------------------------------- Current treatment: Cycle 2 Taxol  carbo Keytruda  (started 04/19/2024) participation in S2205 clinical  trial  Chemo toxicities: Fatigue Slight leukopenia: I reduced the dosage of Taxol   Otherwise tolerated chemo fairly well. Return to clinic weekly for chemo and every other week for follow-up with me ------------------------------------- Assessment and Plan Assessment & Plan Stage II right breast cancer, estrogen receptor negative Undergoing chemotherapy with mild neutropenia. Blood counts adequate, neutrophils slightly low. Tolerating treatment well. - Proceed with chemotherapy with adjusted dosage. - Encourage intake of protein-rich foods. - Schedule booster shots for white cells after next treatment.  Chemotherapy-induced neutropenia Mild neutropenia with adjusted chemotherapy dosage to prevent further decline. - Administer booster shots for white cells after next treatment.  Gastroesophageal reflux symptoms Symptoms likely related to chemotherapy. - Prescribe Protonix  or similar proton pump inhibitor.  Type 2 diabetes mellitus Occasional  cravings for sweets. - Monitor blood sugar levels. - Advise moderation in sweets consumption.  Left shoulder and arm pain Persistent pain extending to the elbow.  Fatigue No significant increase in fatigue beyond normal levels. Tolerating chemotherapy well.      No orders of the defined types were placed in this encounter.  The patient has a good understanding of the overall plan. she agrees with it. she will call with any problems that may develop before the next visit here. Total time spent: 30 mins including face to face time and time spent for planning, charting and co-ordination of care   Naomi MARLA Chad, MD 04/25/24

## 2024-04-25 NOTE — Research (Unsigned)
 [name], ICE COMPRESS: RANDOMIZED TRIAL OF LIMB CRYOCOMPRESSION VERSUS CONTINUOUS COMPRESSION VERSUS LOW CYCLIC COMPRESSION FOR THE PREVENTION OF TAXANE-INDUCED PERIPHERAL NEUROPATHY    Week 2 Patient arrives today unaccompanied for the week 2 treatment. Confirmed patient does not have wounds, sores, or lesions to extremities. Patient has not had any vaccinations since last visit.   MD/PROVIDER VISIT: Patient sees Dr.Gudena for today's visit.    ADVERSE EVENTS: Per Dr.Gudena Paclitaxel  dose reduced due to low ANC. Dose reduction not related to Paxman study device.  Solicited AEs reviewed with patient.  She denies any sensory or motor neuropathy symptoms. See AE table below   Baseline AEs Adverse Event CTCAE Grade Onset date Resolved date Relationship to Study Intervention Action Taken Comments  Skin atrophy (solicited) 0            Skin hyperpigmentation (solicited) 0            Skin hypopigmentation (solicited) 0            Skin induration (solicited) 0            Skin ulceration (solicited) 0            Rash maculopapular (solicited) 0            Nail changes (solicited) 0            Cold intolerance (solicited) (general disorders and administration site conditions- other) 0            Frostbite (solicited) (skin and subcutaneous tissue disorders- other) 0                STUDY INTERVENTION & TOLERABILITY ASSESSMENTS: 1:01 pm am Device wraps applied; pre-treatment started set on Arm 1 CyroCompression with temperature setting at 11 degree celsius and cyclic pressure 5-15 mm Hg.  1:10 pm Required 5-15 minute Tolerability check- pt states tolerable to cyrocompression with temperature setting at 11 degree celsius and cyclic pressure 5-15 mm Hg. 8:55 pm Moved to treatment phase. Cyrocompression with temperature setting at 11 degree celsius and cyclic pressure 5-15 mm Hg. 7:98 pm Required 60 minutes tolerability check. Pt states tolerable to cyrocompression with temperature setting at 11 degree  celsius and cyclic pressure 5-15 mm Hg. 6:98 pm Required 60 minutes tolerability check. Pt states tolerable to cyrocompression with temperature setting at 11 degree celsius and cyclic pressure 5-15 mm Hg. 7:44 pm Taxane infusion end time. Moved to post-treatment phase. 2:40 pm Bathroom break 2:48 pm  Wraps replaced and intervention restarted 3:33 pm  Wraps removed. Intervention ended. Pt made up bathroom break (8 minutes total) during post treatment phase per protocol. Confirmed no new wounds, sores, or lesions.    Was the treatment paused for a bathroom break? Yes For how long? A 8 minutes bathroom break (8 minutes total made up) Was this time made up prior to treatment completion? Yes   The patient was thanked for their time and continued voluntary participation in this study. She understands research nurse will meet her again at next scheduled taxane infusion 05/02/24  to apply study device. [name] has been provided direct contact information and is encouraged to contact this Nurse for any needs or question.   [name], RN, BSN Clinical Research Nurse [phone] 04/25/2024

## 2024-04-25 NOTE — Research (Signed)
 DCP-001: Use of a Clinical Trial Screening Tool to Address Cancer Health Disparities in the [facility] Community Oncology Research Program [facility])  [name] was identified by Dr.Gudena as a potential candidate for the above listed study.  This Clinical Research Nurse met with Christy Wiley, [id number], on 04/25/24 in a manner and location that ensures patient privacy to discuss participation in the above listed research study.  Patient is Unaccompanied.  A copy of the informed consent document and separate HIPAA Authorization was provided to the patient.  Patient reads, speaks, and understands [place].   Patient was provided with the business card of this Nurse and encouraged to contact the research team with any questions.  Approximately 8 minutes were spent with the patient reviewing the informed consent documents.  Patient was provided the option of taking informed consent documents home to review and was encouraged to review at their convenience with their support network, including other care providers. Patient took the consent documents home to review. Informed pt that this is a one time study that asks some personal and demographic questions and participation in the clinical trial is voluntary. Pt did not have any questions at this time. Informed pt that this nurse will follow up with pt at her next visit. She is in agreement.   Christy Larsen, RN, BSN Clinical Research Nurse [phone] 04/25/2024

## 2024-04-25 NOTE — Assessment & Plan Note (Signed)
 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%    CT CAP 04/12/2024: Right breast mass and prominent right axillary lymph nodes, calcified omental nodules (probably granulomatous disease) peritoneal carcinomatosis not excluded.,  Left ovary lesion 3.8 cm Bone scan 04/13/2024: 2 foci of increased uptake in the cervical spine region indeterminate recommend MRI spine Breast MRI 04/13/2024: Right breast non-mass enhancement 9 cm, 3 abnormal right axillary lymph nodes   Treatment plan: Neoadjuvant chemotherapy with Taxol  carbo Keytruda  weekly x 12 followed by Adriamycin Cytoxan Keytruda  followed by Keytruda  maintenance Breast conserving surgery with ALND Adjuvant radiation therapy  GYN evaluation for the ovarian adnexal lesion: Pending Cervical spine MRI are pending -------------------------------------------------------------------------------------------------------------------- Current treatment: Cycle 2 Taxol  carbo Keytruda  (started 04/19/2024) participation in S2205 clinical trial  Chemo toxicities:  Return to clinic weekly for chemo and every other week for follow-up with me

## 2024-04-26 ENCOUNTER — Ambulatory Visit

## 2024-04-26 ENCOUNTER — Ambulatory Visit: Admitting: Hematology and Oncology

## 2024-04-26 ENCOUNTER — Other Ambulatory Visit

## 2024-04-26 ENCOUNTER — Encounter: Payer: Self-pay | Admitting: Hematology and Oncology

## 2024-04-26 ENCOUNTER — Encounter: Payer: Self-pay | Admitting: General Practice

## 2024-04-26 NOTE — Progress Notes (Signed)
 [facility] Spiritual Care Note  Made follow-up phone call. Ms Lyday was in good spirits, citing feeling well so far through her treatment process and being in held in prayer by so many people who care about her. Additionally, she suggests that she is too busy to have time to dwell on stressors. In fact, she is planning to reduce some of her commitments in order to focus on her health.  Ms Wunder welcomes continued Spiritual Care calls for spiritual/emotional support and encouragement. We plan to speak next month after she is further along in her treatment process.  [address] Olam Corrigan, [place], [facility] Pager [phone] Voicemail [phone]

## 2024-04-27 ENCOUNTER — Encounter: Payer: Self-pay | Admitting: Obstetrics and Gynecology

## 2024-04-27 ENCOUNTER — Ambulatory Visit: Admitting: Obstetrics and Gynecology

## 2024-04-27 VITALS — BP 135/82 | HR 71 | Ht 63.0 in | Wt 145.0 lb

## 2024-04-27 DIAGNOSIS — C50811 Malignant neoplasm of overlapping sites of right female breast: Secondary | ICD-10-CM | POA: Diagnosis not present

## 2024-04-27 DIAGNOSIS — N838 Other noninflammatory disorders of ovary, fallopian tube and broad ligament: Secondary | ICD-10-CM | POA: Diagnosis not present

## 2024-04-27 DIAGNOSIS — Z171 Estrogen receptor negative status [ER-]: Secondary | ICD-10-CM

## 2024-04-27 NOTE — Progress Notes (Signed)
 NEW GYNECOLOGY VISIT Chief Complaint  Patient presents with   New Patient (Initial Visit)    GYN     Subjective:  Christy Wiley is a 73 y.o. G1P0010 who presents for consultation on ovarian mass.  She was recently diagnosed with breast cancer. Had CT chest/abd/pelvis as part of her staging. Recent oncology note and CT imaging reviewed. She is undergoing chemo prior to surgical management.  She has no previous history of cancer prior to this diagnosis and denies previous pelvic infections.  She reports family history of ovarian cancer in maternal aunt, diagnosed age 3, passed away from ovarian cancer.  She had hysterectomy for fibroids in her late 30s. 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 Mass in the lateral right breast containing a biopsy marking clip, consistent with known primary breast malignancy. 2. Asymmetrically prominent right axillary lymph nodes consistent with biopsy-proven nodal metastatic disease. 3. Multiple densely calcified omental nodules as well as multiple calcified nodules scattered throughout the low pelvis. These are nonspecific and may reflect sequelae of prior granulomatous disease, however treated peritoneal carcinomatosis is not excluded. Low pelvic nodules may additionally reflect venous phleboliths but are generally in peripheral locations not necessarily characteristic thereof. Correlate with any referable history of malignancy or prior imaging. 4. Low intermediate attenuation lesion of the left ovary or adnexa measuring 3.8 x 2.8 cm. This is incompletely characterized although generally worrisome for mass in the late postmenopausal setting; as above, comparison to prior imaging if available may be helpful, otherwise consider dedicated pelvic MRI with contrast enhancement  to further assess.    OB History     Gravida  1   Para      Term      Preterm      AB  1   Living  0      SAB      IAB      Ectopic      Multiple      Live Births              Past Medical History:  Diagnosis Date   Anemia    Arthritis    Blood transfusion without reported diagnosis    Cancer (HCC)    Diabetes (HCC)    Family history of ovarian cancer    Family history of prostate cancer    Fibroid    Vaginal Pap smear, abnormal     Past Surgical History:  Procedure Laterality Date   ABDOMINAL HYSTERECTOMY     BREAST BIOPSY Right 03/28/2024   US  RT BREAST BX W LOC DEV 1ST LESION IMG BX SPEC US  GUIDE 03/28/2024 [facility]   BREAST BIOPSY Right 03/28/2024   US  RT BREAST BX W LOC DEV EA ADD LESION IMG BX SPEC US  GUIDE 03/28/2024 [facility]MO[facility]   PARTIAL HYSTERECTOMY     PORTACATH PLACEMENT Left 04/14/2024   Procedure: INSERTION, TUNNELED CENTRAL VENOUS DEVICE, WITH PORT;  Surgeon: Curvin Deward MOULD, MD;  Location:  SURGERY CENTER;  Service: General;  Laterality: Left;  PORT PLACEMENT WITH ULTRASOUND GUIDANCE    Social History   Socioeconomic History   Marital status: Single    Spouse name: Not on file   Number of children: 0   Years of education: Not on file   Highest education level: Not on file  Occupational History  Not on file  Tobacco Use   Smoking status: Former    Current packs/day: 0.00    Types: Cigarettes    Quit date: 01/1996    Years since quitting: 28.3   Smokeless tobacco: Never  Vaping Use   Vaping status: Never Used  Substance and Sexual Activity   Alcohol  use: Never   Drug use: Never   Sexual activity: Not Currently  Other Topics Concern   Not on file  Social History Narrative   Not on file   Social Drivers of Health   Financial Resource Strain: Not on file  Food Insecurity: No Food Insecurity (04/05/2024)   Hunger Vital Sign    Worried About Running Out of Food in the Last Year: Never true     Ran Out of Food in the Last Year: Never true  Transportation Needs: No Transportation Needs (04/05/2024)   PRAPARE - Administrator, Civil Service (Medical): No    Lack of Transportation (Non-Medical): No  Physical Activity: Not on file  Stress: Not on file  Social Connections: Not on file    Family History  Problem Relation Age of Onset   Cancer Brother    Prostate cancer Brother    Ovarian cancer Maternal Aunt    Cervical cancer Maternal Aunt    Breast cancer Neg Hx     Current Outpatient Medications on File Prior to Visit  Medication Sig Dispense Refill   atorvastatin (LIPITOR) 40 MG tablet Take 40 mg by mouth.     lidocaine -prilocaine  (EMLA ) cream Apply to affected area once (Patient not taking: Reported on 04/25/2024) 30 g 3   lisinopril (ZESTRIL) 5 MG tablet Take 5 mg by mouth.     meloxicam (MOBIC) 7.5 MG tablet Take 7.5 mg by mouth daily as needed.     ondansetron  (ZOFRAN ) 8 MG tablet Take 1 tablet (8 mg total) by mouth every 8 (eight) hours as needed for nausea or vomiting. Start on the third day after chemotherapy. 30 tablet 1   oxyCODONE  (ROXICODONE ) 5 MG immediate release tablet Take 1 tablet (5 mg total) by mouth every 6 (six) hours as needed. 10 tablet 0   pantoprazole  (PROTONIX ) 40 MG tablet Take 1 tablet (40 mg total) by mouth daily. 30 tablet 3   prochlorperazine  (COMPAZINE ) 10 MG tablet Take 1 tablet (10 mg total) by mouth every 6 (six) hours as needed for nausea or vomiting. 30 tablet 1   Tirzepatide (MOUNJARO Martin) Inject into the skin.     No current facility-administered medications on file prior to visit.    Allergies  Allergen Reactions   Penicillin G Other (See Comments)     Objective:   Vitals:   04/27/24 1448 04/27/24 1455  BP: (!) 143/83 135/82  Pulse: 81 71  Weight: 145 lb (65.8 kg)   Height: 5' 3 (1.6 m)    Physical Examination:   General appearance - well appearing, and in no distress  Mental status - alert, oriented to person,  place, and time  Psych:  normal mood and affect  Abdomen - soft, nontender, nondistended, no masses or organomegaly  Pelvic -  VULVA: normal appearing vulva with no masses, tenderness or lesions   VAGINA: normal appearing vagina with normal color and discharge, no lesions   CERVIX: surgically absent  UTERUS: surgically absent  ADNEXA: left adnexal fullness, no right adnexal masses    Chaperone present for exam  Assessment and Plan:  1. Ovarian mass (Primary) This is a  new problem of uncertain etiology. Oncology note and CT imaging reviewed. Recommend additional imaging first with ultrasound and tumor markers with ROMA. Will reach out to gyn oncology for their input as well. The size of the mass isn't worrisome alone however calcified omental nodules were noted. Reviewed all of the following with the patient in detail and relayed that we need more information before making a decision on how best to proceed next. She verbalized her understanding. - Ovarian Malignancy Risk-ROMA - US  PELVIC COMPLETE WITH TRANSVAGINAL; Future  2. Malignant neoplasm of overlapping sites of right breast in female, estrogen receptor negative (HCC) Undergoing treatment.    No follow-ups on file.  Future Appointments  Date Time Provider Department Center  05/01/2024  7:10 AM [facility] MR 1 [facility]315MRI [facility]  05/01/2024  9:20 AM [facility] MM IR 1 [facility] CE  05/02/2024 11:00 AM [facility] FLUSH [facility] None  05/02/2024 11:20 AM [place] Morna Pickle, NP [facility] None  05/02/2024 12:30 PM [facility] INFUSION [facility] None  05/03/2024  1:45 PM [facility] FLUSH [facility] None  05/04/2024  1:45 PM [facility] FLUSH [facility] None  05/05/2024  1:45 PM [facility] FLUSH [facility] None    Rollo ONEIDA Bring, MD, FACOG Obstetrician & Gynecologist, [facility] for [facility], [facility]

## 2024-04-27 NOTE — Progress Notes (Signed)
 73 y.o. New GYN presents for Ovarian Mass.

## 2024-04-28 ENCOUNTER — Encounter: Payer: Self-pay | Admitting: *Deleted

## 2024-04-28 ENCOUNTER — Other Ambulatory Visit: Payer: Self-pay

## 2024-05-01 ENCOUNTER — Ambulatory Visit
Admission: RE | Admit: 2024-05-01 | Discharge: 2024-05-01 | Disposition: A | Discharge status: Home / Self Care | Source: Ambulatory Visit | Attending: Hematology and Oncology | Admitting: Hematology and Oncology

## 2024-05-01 DIAGNOSIS — R928 Other abnormal and inconclusive findings on diagnostic imaging of breast: Secondary | ICD-10-CM

## 2024-05-01 DIAGNOSIS — Z171 Estrogen receptor negative status [ER-]: Secondary | ICD-10-CM

## 2024-05-01 MED ORDER — GADOPICLENOL 0.5 MMOL/ML IV SOLN
6.5000 mL | Freq: Once | INTRAVENOUS | Status: AC | PRN
Start: 2024-05-01 — End: 2024-05-01
  Administered 2024-05-01: 6.5 mL via INTRAVENOUS

## 2024-05-02 ENCOUNTER — Inpatient Hospital Stay (HOSPITAL_BASED_OUTPATIENT_CLINIC_OR_DEPARTMENT_OTHER): Admitting: Adult Health

## 2024-05-02 ENCOUNTER — Telehealth: Payer: Self-pay | Admitting: *Deleted

## 2024-05-02 ENCOUNTER — Other Ambulatory Visit: Payer: Self-pay | Admitting: Obstetrics and Gynecology

## 2024-05-02 ENCOUNTER — Inpatient Hospital Stay

## 2024-05-02 ENCOUNTER — Encounter: Payer: Self-pay | Admitting: Hematology and Oncology

## 2024-05-02 ENCOUNTER — Encounter: Payer: Self-pay | Admitting: *Deleted

## 2024-05-02 VITALS — BP 131/71 | HR 98 | Temp 97.8°F | Resp 18 | Ht 63.0 in | Wt 143.4 lb

## 2024-05-02 DIAGNOSIS — Z171 Estrogen receptor negative status [ER-]: Secondary | ICD-10-CM | POA: Diagnosis not present

## 2024-05-02 DIAGNOSIS — C50811 Malignant neoplasm of overlapping sites of right female breast: Secondary | ICD-10-CM | POA: Diagnosis not present

## 2024-05-02 DIAGNOSIS — R1900 Intra-abdominal and pelvic swelling, mass and lump, unspecified site: Secondary | ICD-10-CM

## 2024-05-02 DIAGNOSIS — D0511 Intraductal carcinoma in situ of right breast: Secondary | ICD-10-CM | POA: Diagnosis not present

## 2024-05-02 LAB — CMP (CANCER CENTER ONLY)
ALT: 10 U/L (ref 0–44)
AST: 12 U/L — ABNORMAL LOW (ref 15–41)
Albumin: 3.8 g/dL (ref 3.5–5.0)
Alkaline Phosphatase: 60 U/L (ref 38–126)
Anion gap: 4 — ABNORMAL LOW (ref 5–15)
BUN: 9 mg/dL (ref 8–23)
CO2: 25 mmol/L (ref 22–32)
Calcium: 8.2 mg/dL — ABNORMAL LOW (ref 8.9–10.3)
Chloride: 108 mmol/L (ref 98–111)
Creatinine: 0.67 mg/dL (ref 0.44–1.00)
GFR, Estimated: >60 mL/min (ref >60)
Glucose, Bld: 186 mg/dL — ABNORMAL HIGH (ref 70–99)
Potassium: 4.1 mmol/L (ref 3.5–5.1)
Sodium: 137 mmol/L (ref 135–145)
Total Bilirubin: 0.3 mg/dL (ref 0.0–1.2)
Total Protein: 6.4 g/dL — ABNORMAL LOW (ref 6.5–8.1)

## 2024-05-02 LAB — CBC WITH DIFFERENTIAL (CANCER CENTER ONLY)
Abs Immature Granulocytes: 0 K/uL (ref 0.00–0.07)
Basophils Absolute: 0 K/uL (ref 0.0–0.1)
Basophils Relative: 1 %
Eosinophils Absolute: 0.1 K/uL (ref 0.0–0.5)
Eosinophils Relative: 2 %
HCT: 34.7 % — ABNORMAL LOW (ref 36.0–46.0)
Hemoglobin: 11.7 g/dL — ABNORMAL LOW (ref 12.0–15.0)
Immature Granulocytes: 0 %
Lymphocytes Relative: 49 %
Lymphs Abs: 1.7 K/uL (ref 0.7–4.0)
MCH: 29.1 pg (ref 26.0–34.0)
MCHC: 33.7 g/dL (ref 30.0–36.0)
MCV: 86.3 fL (ref 80.0–100.0)
Monocytes Absolute: 0.2 K/uL (ref 0.1–1.0)
Monocytes Relative: 7 %
Neutro Abs: 1.5 K/uL — ABNORMAL LOW (ref 1.7–7.7)
Neutrophils Relative %: 41 %
Platelet Count: 305 K/uL (ref 150–400)
RBC: 4.02 MIL/uL (ref 3.87–5.11)
RDW: 12.4 % (ref 11.5–15.5)
WBC Count: 3.5 K/uL — ABNORMAL LOW (ref 4.0–10.5)
nRBC: 0 % (ref 0.0–0.2)

## 2024-05-02 LAB — SURGICAL PATHOLOGY

## 2024-05-02 MED ORDER — SODIUM CHLORIDE 0.9 % IV SOLN: INTRAVENOUS | Status: DC

## 2024-05-02 MED ORDER — PALONOSETRON HCL INJECTION 0.25 MG/5ML
0.2500 mg | Freq: Once | INTRAVENOUS | Status: AC
  Administered 2024-05-02: 0.25 mg via INTRAVENOUS
  Filled 2024-05-02: qty 5

## 2024-05-02 MED ORDER — DEXAMETHASONE SODIUM PHOSPHATE 10 MG/ML IJ SOLN
10.0000 mg | Freq: Once | INTRAMUSCULAR | Status: AC
  Administered 2024-05-02: 10 mg via INTRAVENOUS
  Filled 2024-05-02: qty 1

## 2024-05-02 MED ORDER — FAMOTIDINE IN NACL 20-0.9 MG/50ML-% IV SOLN
20.0000 mg | Freq: Once | INTRAVENOUS | Status: AC
  Administered 2024-05-02: 20 mg via INTRAVENOUS
  Filled 2024-05-02: qty 50

## 2024-05-02 MED ORDER — SODIUM CHLORIDE 0.9 % IV SOLN
65.0000 mg/m2 | Freq: Once | INTRAVENOUS | Status: AC
  Administered 2024-05-02: 114 mg via INTRAVENOUS
  Filled 2024-05-02: qty 19

## 2024-05-02 MED ORDER — SODIUM CHLORIDE 0.9 % IV SOLN
117.9000 mg | Freq: Once | INTRAVENOUS | Status: AC
  Administered 2024-05-02: 120 mg via INTRAVENOUS
  Filled 2024-05-02: qty 12

## 2024-05-02 MED ORDER — DIPHENHYDRAMINE HCL 50 MG/ML IJ SOLN
50.0000 mg | Freq: Once | INTRAMUSCULAR | Status: AC
  Administered 2024-05-02: 50 mg via INTRAVENOUS
  Filled 2024-05-02: qty 1

## 2024-05-02 NOTE — Progress Notes (Unsigned)
 [facility] Cancer Follow up:    Christy Francisco, MD [address] [place] [address]   DIAGNOSIS: Cancer Staging  Malignant neoplasm of overlapping sites of right breast in female, estrogen receptor negative (HCC) Staging form: Breast, AJCC 8th Edition - Clinical: Stage IIB (cT1c, cN1, cM0, G3, ER-, PR-, HER2-) - Signed by Odean Potts, MD on 04/05/2024 Stage prefix: Initial diagnosis Histologic grading system: 3 grade system    SUMMARY OF ONCOLOGIC HISTORY: Oncology History  Malignant neoplasm of overlapping sites of right breast in female, estrogen receptor negative (HCC)  03/28/2024 Initial Diagnosis   Screening mammogram detected right breast asymmetry, 2 lesions 1.9 cm at 10:00 and 0.7 cm at the 11:00: Biopsy: 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  The report date is April 19, 2024.  The CancerNext-Expanded gene panel offered by [facility] and includes sequencing, rearrangement, and RNA analysis for the following 77 genes: AIP, ALK, APC, ATM, BAP1, BARD1, BMPR1A, BRCA1, BRCA2, BRIP1, CDC73, CDH1, CDK4, CDKN1B, CDKN2A, CEBPA, CHEK2, CTNNA1, DDX41, DICER1, ETV6, FH, FLCN, GATA2, LZTR1, MAX, MBD4, MEN1, MET, MLH1, MSH2, MSH3, MSH6, MUTYH, NF1, NF2, NTHL1, PALB2, PHOX2B, PMS2, POT1, PRKAR1A, PTCH1, PTEN, RAD51C, RAD51D, RB1, RET, RPS20, RUNX1, SDHA, SDHAF2, SDHB, SDHC, SDHD, SMAD4, SMARCA4,  SMARCB1, SMARCE1, STK11, SUFU, TMEM127, TP53, TSC1, TSC2, VHL, and WT1 (sequencing and deletion/duplication); AXIN2, CTNNA1, DDX41, EGFR, HOXB13, KIT, MBD4, MITF, MSH3, PDGFRA, POLD1 and POLE (sequencing only); EPCAM and GREM1 (deletion/duplication only). RNA data is routinely analyzed for use in variant interpretation for all genes.      CURRENT THERAPY: neoadjuvant chemotherapy, taxol /carbo/keytruda   INTERVAL HISTORY:  Discussed the use of AI scribe software for clinical note transcription with the patient, who gave verbal consent to proceed.  Christy Wiley 73 y.o. female returns for    Patient Active Problem List   Diagnosis Date Noted   Genetic testing 04/24/2024   Family history of prostate cancer    Family history of ovarian cancer    Malignant neoplasm of overlapping sites of right breast in female, estrogen receptor negative (HCC) 04/04/2024    is allergic to penicillin g.  MEDICAL HISTORY: Past Medical History:  Diagnosis Date   Anemia    Arthritis    Blood transfusion without reported diagnosis    Cancer (HCC)    Diabetes (HCC)    Family history of ovarian cancer    Family history of prostate cancer    Fibroid    Vaginal Pap smear, abnormal     SURGICAL HISTORY: Past Surgical History:  Procedure Laterality Date   ABDOMINAL HYSTERECTOMY     BREAST BIOPSY Right 03/28/2024   US  RT BREAST BX W LOC DEV 1ST LESION IMG BX SPEC US  GUIDE 03/28/2024 [facility]   BREAST BIOPSY Right 03/28/2024   US  RT BREAST BX W LOC DEV EA ADD LESION IMG BX SPEC US  GUIDE 03/28/2024 [facility]MO[facility]   PARTIAL  HYSTERECTOMY     PORTACATH PLACEMENT Left 04/14/2024   Procedure: INSERTION, TUNNELED CENTRAL VENOUS DEVICE, WITH PORT;  Surgeon: Curvin Deward MOULD, MD;  Location: [facility];  Service: General;  Laterality: Left;  PORT PLACEMENT WITH ULTRASOUND GUIDANCE    SOCIAL HISTORY: Social History   Socioeconomic History   Marital status: Single    Spouse name:  Not on file   Number of children: 0   Years of education: Not on file   Highest education level: Not on file  Occupational History   Not on file  Tobacco Use   Smoking status: Former    Current packs/day: 0.00    Types: Cigarettes    Quit date: 01/1996    Years since quitting: 28.3   Smokeless tobacco: Never  Vaping Use   Vaping status: Never Used  Substance and Sexual Activity   Alcohol  use: Never   Drug use: Never   Sexual activity: Not Currently  Other Topics Concern   Not on file  Social History Narrative   Not on file   Social Drivers of Health   Financial Resource Strain: Not on file  Food Insecurity: No Food Insecurity (04/05/2024)   Hunger Vital Sign    Worried About Running Out of Food in the Last Year: Never true    Ran Out of Food in the Last Year: Never true  Transportation Needs: No Transportation Needs (04/05/2024)   PRAPARE - Administrator, Civil Service (Medical): No    Lack of Transportation (Non-Medical): No  Physical Activity: Not on file  Stress: Not on file  Social Connections: Not on file  Intimate Partner Violence: Not At Risk (04/05/2024)   Humiliation, Afraid, Rape, and Kick questionnaire    Fear of Current or Ex-Partner: No    Emotionally Abused: No    Physically Abused: No    Sexually Abused: No    FAMILY HISTORY: Family History  Problem Relation Age of Onset   Cancer Brother    Prostate cancer Brother    Ovarian cancer Maternal Aunt    Cervical cancer Maternal Aunt    Breast cancer Neg Hx     Review of Systems - Oncology    PHYSICAL EXAMINATION   Onc Performance Status - 05/02/24 1149       ECOG Perf Status   ECOG Perf Status Restricted in physically strenuous activity but ambulatory and able to carry out work of a light or sedentary nature, e.g., light house work, office work      KPS SCALE   KPS % SCORE Able to carry on normal activity, minor s/s of disease          Vitals:   05/02/24 1145  BP: 131/71   Pulse: 98  Resp: 18  Temp: 97.8 F (36.6 C)  SpO2: 100%    Physical Exam  LABORATORY DATA:  CBC    Component Value Date/Time   WBC 3.5 (L) 04/25/2024 1128   RBC 4.10 04/25/2024 1128   HGB 11.9 (L) 04/25/2024 1128   HCT 35.2 (L) 04/25/2024 1128   PLT 237 04/25/2024 1128   MCV 85.9 04/25/2024 1128   MCH 29.0 04/25/2024 1128   MCHC 33.8 04/25/2024 1128   RDW 12.3 04/25/2024 1128   LYMPHSABS 1.5 04/25/2024 1128   MONOABS 0.2 04/25/2024 1128   EOSABS 0.1 04/25/2024 1128   BASOSABS 0.0 04/25/2024 1128    CMP     Component Value Date/Time   NA 140 04/25/2024 1128  K 4.1 04/25/2024 1128   CL 109 04/25/2024 1128   CO2 27 04/25/2024 1128   GLUCOSE 143 (H) 04/25/2024 1128   BUN 13 04/25/2024 1128   CREATININE 0.75 04/25/2024 1128   CALCIUM 8.6 (L) 04/25/2024 1128   PROT 6.7 04/25/2024 1128   ALBUMIN 3.9 04/25/2024 1128   AST 13 (L) 04/25/2024 1128   ALT 11 04/25/2024 1128   ALKPHOS 51 04/25/2024 1128   BILITOT 0.4 04/25/2024 1128   GFRNONAA >60 04/25/2024 1128     ASSESSMENT and THERAPY PLAN:   No problem-specific Assessment & Plan notes found for this encounter.     All questions were answered. The patient knows to call the clinic with any problems, questions or concerns. We can certainly see the patient much sooner if necessary.  Total encounter time:*** minutes*in face-to-face visit time, chart review, lab review, care coordination, order entry, and documentation of the encounter time.    [name], NP 05/02/24 11:58 AM Medical Oncology and Hematology [facility] [address] [place], [place] [address] Tel. [phone]    Fax. [phone]  *Total Encounter Time as defined by the Centers for Medicare and Medicaid Services includes, in addition to the face-to-face time of a patient visit (documented in the note above) non-face-to-face time: obtaining and reviewing outside history, ordering and reviewing medications, tests or  procedures, care coordination (communications with other health care professionals or caregivers) and documentation in the medical record.

## 2024-05-02 NOTE — Telephone Encounter (Signed)
 [name], APP seeing the patient today and will give the new patient appt on 10/17 at 10:30 am with Dr Viktoria

## 2024-05-02 NOTE — Research (Unsigned)
 [name], ICE COMPRESS: RANDOMIZED TRIAL OF LIMB CRYOCOMPRESSION VERSUS CONTINUOUS COMPRESSION VERSUS LOW CYCLIC COMPRESSION FOR THE PREVENTION OF TAXANE-INDUCED PERIPHERAL NEUROPATHY    Week 3 Patient arrives today unaccompanied for the week 3 treatment. Confirmed patient does not have wounds, sores, or lesions to extremities. Patient has not had any vaccinations since last visit.   MD/PROVIDER VISIT: Patient see [name] for today's visit.    ADVERSE EVENTS: Solicited AEs reviewed with patient.  She denies any sensory or motor neuropathy symptoms. See AE table below   Baseline AEs Adverse Event CTCAE Grade Onset date Resolved date Relationship to Study Intervention Action Taken Comments  Skin atrophy (solicited) 0            Skin hyperpigmentation (solicited) 0            Skin hypopigmentation (solicited) 0            Skin induration (solicited) 0            Skin ulceration (solicited) 0            Rash maculopapular (solicited) 0            Nail changes (solicited) 0            Cold intolerance (solicited) (general disorders and administration site conditions- other) 0            Frostbite (solicited) (skin and subcutaneous tissue disorders- other) 0                STUDY INTERVENTION & TOLERABILITY ASSESSMENTS: 1:30 pm am Device wraps applied; pre-treatment started set on Arm 1 CyroCompression with temperature setting at 11 degree celsius and cyclic pressure 5-15 mm Hg.  1:40 pm Required 5-15 minute Tolerability check- pt states tolerable to cyrocompression with temperature setting at 11 degree celsius and cyclic pressure 5-15 mm Hg. 7:99 pm- Cancer Center experienced a power outage and Paxman machine rebooted on the back up generator and had to start cooling again 2:05 pm Taxane infusion started. Moved to treatment phase. Cyrocompression with temperature setting at 11 degree celsius and cyclic pressure 5-15 mm Hg. Paxman machine showed a low flow error.  2:06 pm Bathroom  break 2:11 pm  Paxman machine showed low flow error signal. Unable to move past low flow error signal on machine so Paxman machine was changed out with backup Paxman machine. 2:25 pm Wraps replaced and intervention restarted  New Paxman machine started to have a chemical burning odor to it and pt felt uncomfortable with using device on her limbs so she withdrew from use of the Paxman device for the rest of her treatment today.     Pt denied any changes or complications to her limbs that utilized the study device.  The patient was thanked for their time and continued voluntary participation in this study. She understands research nurse will meet her again at next scheduled taxane infusion 05/09/24  to apply study device. [name] has been provided direct contact information and is encouraged to contact this Nurse for any needs or question.   [name], RN, BSN Clinical Research Nurse [phone] 05/02/2024

## 2024-05-02 NOTE — Patient Instructions (Signed)
 [facility] - A DEPT OF [facility]  Discharge Instructions: Thank you for choosing [facility] to provide your oncology and hematology care.   If you have a lab appointment with the Cancer Center, please go directly to the Cancer Center and check in at the registration area.   Wear comfortable clothing and clothing appropriate for easy access to any Portacath or PICC line.   We strive to give you quality time with your provider. You may need to reschedule your appointment if you arrive late (15 or more minutes).  Arriving late affects you and other patients whose appointments are after yours.  Also, if you miss three or more appointments without notifying the office, you may be dismissed from the clinic at the provider's discretion.      For prescription refill requests, have your pharmacy contact our office and allow 72 hours for refills to be completed.    Today you received the following chemotherapy and/or immunotherapy agents taxol and carboplatin      To help prevent nausea and vomiting after your treatment, we encourage you to take your nausea medication as directed.  BELOW ARE SYMPTOMS THAT SHOULD BE REPORTED IMMEDIATELY: *FEVER GREATER THAN 100.4 F (38 C) OR HIGHER *CHILLS OR SWEATING *NAUSEA AND VOMITING THAT IS NOT CONTROLLED WITH YOUR NAUSEA MEDICATION *UNUSUAL SHORTNESS OF BREATH *UNUSUAL BRUISING OR BLEEDING *URINARY PROBLEMS (pain or burning when urinating, or frequent urination) *BOWEL PROBLEMS (unusual diarrhea, constipation, pain near the anus) TENDERNESS IN MOUTH AND THROAT WITH OR WITHOUT PRESENCE OF ULCERS (sore throat, sores in mouth, or a toothache) UNUSUAL RASH, SWELLING OR PAIN  UNUSUAL VAGINAL DISCHARGE OR ITCHING   Items with * indicate a potential emergency and should be followed up as soon as possible or go to the Emergency Department if any problems should occur.  Please show the CHEMOTHERAPY ALERT CARD or  IMMUNOTHERAPY ALERT CARD at check-in to the Emergency Department and triage nurse.  Should you have questions after your visit or need to cancel or reschedule your appointment, please contact [facility] - A DE[facility]  Dept: [phone]  and follow the prompts.  Office hours are 8:00 a.m. to 4:30 p.m. Monday - Friday. Please note that voicemails left after 4:00 p.m. may not be returned until the following business day.  We are closed weekends and major holidays. You have access to a nurse at all times for urgent questions. Please call the main number to the clinic Dept: [phone] and follow the prompts.   For any non-urgent questions, you may also contact your provider using MyChart. We now offer e-Visits for anyone 62 and older to request care online for non-urgent symptoms. For details visit [email or web address].   Also download the MyChart app! Go to the app store, search "MyChart", open the app, select [place], and log in with your MyChart username and password.

## 2024-05-03 ENCOUNTER — Inpatient Hospital Stay: Attending: Hematology and Oncology

## 2024-05-03 ENCOUNTER — Other Ambulatory Visit: Payer: Self-pay | Admitting: Hematology and Oncology

## 2024-05-03 ENCOUNTER — Encounter: Payer: Self-pay | Admitting: *Deleted

## 2024-05-03 ENCOUNTER — Other Ambulatory Visit: Payer: Self-pay

## 2024-05-03 VITALS — BP 142/80 | HR 90 | Temp 98.8°F | Resp 17

## 2024-05-03 DIAGNOSIS — Z79899 Other long term (current) drug therapy: Secondary | ICD-10-CM | POA: Diagnosis not present

## 2024-05-03 DIAGNOSIS — Z8049 Family history of malignant neoplasm of other genital organs: Secondary | ICD-10-CM | POA: Insufficient documentation

## 2024-05-03 DIAGNOSIS — K5900 Constipation, unspecified: Secondary | ICD-10-CM | POA: Diagnosis not present

## 2024-05-03 DIAGNOSIS — C773 Secondary and unspecified malignant neoplasm of axilla and upper limb lymph nodes: Secondary | ICD-10-CM | POA: Diagnosis not present

## 2024-05-03 DIAGNOSIS — D6481 Anemia due to antineoplastic chemotherapy: Secondary | ICD-10-CM | POA: Diagnosis not present

## 2024-05-03 DIAGNOSIS — R5383 Other fatigue: Secondary | ICD-10-CM | POA: Diagnosis not present

## 2024-05-03 DIAGNOSIS — D72819 Decreased white blood cell count, unspecified: Secondary | ICD-10-CM | POA: Insufficient documentation

## 2024-05-03 DIAGNOSIS — L608 Other nail disorders: Secondary | ICD-10-CM | POA: Diagnosis not present

## 2024-05-03 DIAGNOSIS — Z5111 Encounter for antineoplastic chemotherapy: Secondary | ICD-10-CM | POA: Diagnosis present

## 2024-05-03 DIAGNOSIS — Z8042 Family history of malignant neoplasm of prostate: Secondary | ICD-10-CM | POA: Diagnosis not present

## 2024-05-03 DIAGNOSIS — R1900 Intra-abdominal and pelvic swelling, mass and lump, unspecified site: Secondary | ICD-10-CM | POA: Insufficient documentation

## 2024-05-03 DIAGNOSIS — M1990 Unspecified osteoarthritis, unspecified site: Secondary | ICD-10-CM | POA: Diagnosis not present

## 2024-05-03 DIAGNOSIS — Z5189 Encounter for other specified aftercare: Secondary | ICD-10-CM | POA: Diagnosis not present

## 2024-05-03 DIAGNOSIS — C50811 Malignant neoplasm of overlapping sites of right female breast: Secondary | ICD-10-CM | POA: Diagnosis present

## 2024-05-03 DIAGNOSIS — Z90710 Acquired absence of both cervix and uterus: Secondary | ICD-10-CM | POA: Insufficient documentation

## 2024-05-03 DIAGNOSIS — E119 Type 2 diabetes mellitus without complications: Secondary | ICD-10-CM | POA: Insufficient documentation

## 2024-05-03 DIAGNOSIS — Z171 Estrogen receptor negative status [ER-]: Secondary | ICD-10-CM | POA: Insufficient documentation

## 2024-05-03 DIAGNOSIS — Z8041 Family history of malignant neoplasm of ovary: Secondary | ICD-10-CM | POA: Diagnosis not present

## 2024-05-03 MED ORDER — FILGRASTIM-SNDZ 300 MCG/0.5ML IJ SOSY
300.0000 ug | PREFILLED_SYRINGE | Freq: Once | INTRAMUSCULAR | Status: AC
  Administered 2024-05-03: 300 ug via SUBCUTANEOUS
  Filled 2024-05-03: qty 0.5

## 2024-05-03 NOTE — Patient Instructions (Signed)
 TAKE 1 CLARITIN ALLERGY PILL (GENERIC NAME: LORATADINE) DAILY FOR 7 DAYS.

## 2024-05-04 ENCOUNTER — Encounter: Payer: Self-pay | Admitting: Hematology and Oncology

## 2024-05-04 ENCOUNTER — Inpatient Hospital Stay

## 2024-05-04 VITALS — BP 137/73 | HR 89 | Temp 98.9°F | Resp 18

## 2024-05-04 DIAGNOSIS — Z171 Estrogen receptor negative status [ER-]: Secondary | ICD-10-CM

## 2024-05-04 DIAGNOSIS — Z5111 Encounter for antineoplastic chemotherapy: Secondary | ICD-10-CM | POA: Diagnosis not present

## 2024-05-04 MED ORDER — FILGRASTIM-SNDZ 300 MCG/0.5ML IJ SOSY
300.0000 ug | PREFILLED_SYRINGE | Freq: Once | INTRAMUSCULAR | Status: AC
  Administered 2024-05-04: 300 ug via SUBCUTANEOUS
  Filled 2024-05-04: qty 0.5

## 2024-05-04 NOTE — Assessment & Plan Note (Signed)
 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%    CT CAP 04/12/2024: Right breast mass and prominent right axillary lymph nodes, calcified omental nodules (probably granulomatous disease) peritoneal carcinomatosis not excluded.,  Left ovary lesion 3.8 cm Bone scan 04/13/2024: 2 foci of increased uptake in the cervical spine region indeterminate recommend MRI spine Breast MRI 04/13/2024: Right breast non-mass enhancement 9 cm, 3 abnormal right axillary lymph nodes   Treatment plan: Neoadjuvant chemotherapy with Taxol  carbo Keytruda  weekly x 12 followed by Adriamycin Cytoxan Keytruda  followed by Keytruda  maintenance Breast conserving surgery with ALND Adjuvant radiation therapy  GYN evaluation for the ovarian adnexal lesion: Pending Cervical spine MRI are pending -------------------------------------------------------------------------------------------------------------------- Current treatment: Taxol  carbo Keytruda  (started 04/19/2024) participation in S2205 clinical trial  Chemo toxicities:

## 2024-05-05 ENCOUNTER — Inpatient Hospital Stay

## 2024-05-05 ENCOUNTER — Encounter: Payer: Self-pay | Admitting: Hematology and Oncology

## 2024-05-05 ENCOUNTER — Other Ambulatory Visit: Payer: Self-pay

## 2024-05-05 VITALS — BP 148/84 | HR 98 | Temp 99.2°F | Resp 18

## 2024-05-05 DIAGNOSIS — C50811 Malignant neoplasm of overlapping sites of right female breast: Secondary | ICD-10-CM

## 2024-05-05 DIAGNOSIS — Z5111 Encounter for antineoplastic chemotherapy: Secondary | ICD-10-CM | POA: Diagnosis not present

## 2024-05-05 MED ORDER — FILGRASTIM-SNDZ 300 MCG/0.5ML IJ SOSY
300.0000 ug | PREFILLED_SYRINGE | Freq: Once | INTRAMUSCULAR | Status: AC
  Administered 2024-05-05: 300 ug via SUBCUTANEOUS
  Filled 2024-05-05: qty 0.5

## 2024-05-08 ENCOUNTER — Encounter: Payer: Self-pay | Admitting: Hematology and Oncology

## 2024-05-09 ENCOUNTER — Encounter: Payer: Self-pay | Admitting: Adult Health

## 2024-05-09 ENCOUNTER — Encounter: Payer: Self-pay | Admitting: *Deleted

## 2024-05-09 ENCOUNTER — Inpatient Hospital Stay

## 2024-05-09 ENCOUNTER — Encounter: Payer: Self-pay | Admitting: Hematology and Oncology

## 2024-05-09 ENCOUNTER — Inpatient Hospital Stay: Admitting: Adult Health

## 2024-05-09 VITALS — BP 137/79 | HR 83 | Temp 98.0°F | Resp 16 | Ht 63.0 in | Wt 144.4 lb

## 2024-05-09 DIAGNOSIS — Z171 Estrogen receptor negative status [ER-]: Secondary | ICD-10-CM | POA: Diagnosis not present

## 2024-05-09 DIAGNOSIS — C50811 Malignant neoplasm of overlapping sites of right female breast: Secondary | ICD-10-CM

## 2024-05-09 DIAGNOSIS — Z5111 Encounter for antineoplastic chemotherapy: Secondary | ICD-10-CM | POA: Diagnosis not present

## 2024-05-09 LAB — CMP (CANCER CENTER ONLY)
ALT: 9 U/L (ref 0–44)
AST: 12 U/L — ABNORMAL LOW (ref 15–41)
Albumin: 3.7 g/dL (ref 3.5–5.0)
Alkaline Phosphatase: 64 U/L (ref 38–126)
Anion gap: 4 — ABNORMAL LOW (ref 5–15)
BUN: 11 mg/dL (ref 8–23)
CO2: 26 mmol/L (ref 22–32)
Calcium: 8.8 mg/dL — ABNORMAL LOW (ref 8.9–10.3)
Chloride: 110 mmol/L (ref 98–111)
Creatinine: 0.63 mg/dL (ref 0.44–1.00)
GFR, Estimated: >60 mL/min (ref >60)
Glucose, Bld: 156 mg/dL — ABNORMAL HIGH (ref 70–99)
Potassium: 3.6 mmol/L (ref 3.5–5.1)
Sodium: 140 mmol/L (ref 135–145)
Total Bilirubin: 0.3 mg/dL (ref 0.0–1.2)
Total Protein: 6.4 g/dL — ABNORMAL LOW (ref 6.5–8.1)

## 2024-05-09 LAB — CBC WITH DIFFERENTIAL (CANCER CENTER ONLY)
Abs Immature Granulocytes: 0.29 K/uL — ABNORMAL HIGH (ref 0.00–0.07)
Basophils Absolute: 0.1 K/uL (ref 0.0–0.1)
Basophils Relative: 1 %
Eosinophils Absolute: 0.1 K/uL (ref 0.0–0.5)
Eosinophils Relative: 1 %
HCT: 31.4 % — ABNORMAL LOW (ref 36.0–46.0)
Hemoglobin: 10.6 g/dL — ABNORMAL LOW (ref 12.0–15.0)
Immature Granulocytes: 5 %
Lymphocytes Relative: 32 %
Lymphs Abs: 1.9 K/uL (ref 0.7–4.0)
MCH: 29.2 pg (ref 26.0–34.0)
MCHC: 33.8 g/dL (ref 30.0–36.0)
MCV: 86.5 fL (ref 80.0–100.0)
Monocytes Absolute: 0.7 K/uL (ref 0.1–1.0)
Monocytes Relative: 13 %
Neutro Abs: 2.8 K/uL (ref 1.7–7.7)
Neutrophils Relative %: 48 %
Platelet Count: 215 K/uL (ref 150–400)
RBC: 3.63 MIL/uL — ABNORMAL LOW (ref 3.87–5.11)
RDW: 13 % (ref 11.5–15.5)
WBC Count: 5.8 K/uL (ref 4.0–10.5)
nRBC: 0 % (ref 0.0–0.2)

## 2024-05-09 MED ORDER — DIPHENHYDRAMINE HCL 50 MG/ML IJ SOLN
50.0000 mg | Freq: Once | INTRAMUSCULAR | Status: AC
  Administered 2024-05-09: 50 mg via INTRAVENOUS
  Filled 2024-05-09: qty 1

## 2024-05-09 MED ORDER — SODIUM CHLORIDE 0.9 % IV SOLN
200.0000 mg | Freq: Once | INTRAVENOUS | Status: AC
  Administered 2024-05-09: 200 mg via INTRAVENOUS
  Filled 2024-05-09: qty 200

## 2024-05-09 MED ORDER — PALONOSETRON HCL INJECTION 0.25 MG/5ML
0.2500 mg | Freq: Once | INTRAVENOUS | Status: AC
  Administered 2024-05-09: 0.25 mg via INTRAVENOUS
  Filled 2024-05-09: qty 5

## 2024-05-09 MED ORDER — SODIUM CHLORIDE 0.9 % IV SOLN
65.0000 mg/m2 | Freq: Once | INTRAVENOUS | Status: AC
  Administered 2024-05-09: 114 mg via INTRAVENOUS
  Filled 2024-05-09: qty 19

## 2024-05-09 MED ORDER — DEXAMETHASONE SODIUM PHOSPHATE 10 MG/ML IJ SOLN
10.0000 mg | Freq: Once | INTRAMUSCULAR | Status: AC
  Administered 2024-05-09: 10 mg via INTRAVENOUS
  Filled 2024-05-09: qty 1

## 2024-05-09 MED ORDER — FAMOTIDINE IN NACL 20-0.9 MG/50ML-% IV SOLN
20.0000 mg | Freq: Once | INTRAVENOUS | Status: AC
  Administered 2024-05-09: 20 mg via INTRAVENOUS
  Filled 2024-05-09: qty 50

## 2024-05-09 MED ORDER — SODIUM CHLORIDE 0.9 % IV SOLN
117.9000 mg | Freq: Once | INTRAVENOUS | Status: AC
  Administered 2024-05-09: 120 mg via INTRAVENOUS
  Filled 2024-05-09: qty 12

## 2024-05-09 MED ORDER — SODIUM CHLORIDE 0.9 % IV SOLN: INTRAVENOUS | Status: DC

## 2024-05-09 NOTE — Assessment & Plan Note (Signed)
 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%    CT CAP 04/12/2024: Right breast mass and prominent right axillary lymph nodes, calcified omental nodules (probably granulomatous disease) peritoneal carcinomatosis not excluded.,  Left ovary lesion 3.8 cm Bone scan 04/13/2024: 2 foci of increased uptake in the cervical spine region indeterminate recommend MRI spine Breast MRI 04/13/2024: Right breast non-mass enhancement 9 cm, 3 abnormal right axillary lymph nodes   Treatment plan: Neoadjuvant chemotherapy with Taxol  carbo Keytruda  weekly x 12 followed by Adriamycin Cytoxan Keytruda  followed by Keytruda  maintenance Breast conserving surgery with ALND Adjuvant radiation therapy  GYN evaluation for the ovarian adnexal lesion: Pending Cervical spine MRI are pending -------------------------------------------------------------------------------------------------------------------- Current treatment: Taxol  carbo Keytruda  (started 04/19/2024) participation in S2205 clinical trial  Chemo toxicities:

## 2024-05-09 NOTE — Progress Notes (Signed)
 [facility] Cancer Follow up:    [name], MD [address] [place] [address]   DIAGNOSIS:  Cancer Staging  Malignant neoplasm of overlapping sites of right breast in female, estrogen receptor negative (HCC) Staging form: Breast, AJCC 8th Edition - Clinical: Stage IIB (cT1c, cN1, cM0, G3, ER-, PR-, HER2-) - Signed by Odean Potts, MD on 04/05/2024 Stage prefix: Initial diagnosis Histologic grading system: 3 grade system    SUMMARY OF ONCOLOGIC HISTORY: Oncology History  Malignant neoplasm of overlapping sites of right breast in female, estrogen receptor negative (HCC)  03/28/2024 Initial Diagnosis   Screening mammogram detected right breast asymmetry, 2 lesions 1.9 cm at 10:00 and 0.7 cm at the 11:00: Biopsy: 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  The report date is April 19, 2024.  The CancerNext-Expanded gene panel offered by [facility] and includes sequencing, rearrangement, and RNA analysis for the following 77 genes: AIP, ALK, APC, ATM, BAP1, BARD1, BMPR1A, BRCA1, BRCA2, BRIP1, CDC73, CDH1, CDK4, CDKN1B, CDKN2A, CEBPA, CHEK2, CTNNA1, DDX41, DICER1, ETV6, FH, FLCN, GATA2, LZTR1, MAX, MBD4, MEN1, MET, MLH1, MSH2, MSH3, MSH6, MUTYH, NF1, NF2, NTHL1, PALB2, PHOX2B, PMS2, POT1, PRKAR1A, PTCH1, PTEN, RAD51C, RAD51D, RB1, RET, RPS20, RUNX1, SDHA, SDHAF2, SDHB, SDHC, SDHD, SMAD4, SMARCA4,  SMARCB1, SMARCE1, STK11, SUFU, TMEM127, TP53, TSC1, TSC2, VHL, and WT1 (sequencing and deletion/duplication); AXIN2, CTNNA1, DDX41, EGFR, HOXB13, KIT, MBD4, MITF, MSH3, PDGFRA, POLD1 and POLE (sequencing only); EPCAM and GREM1 (deletion/duplication only). RNA data is routinely analyzed for use in variant interpretation for all genes.      CURRENT THERAPY: Taxol /Carbo/Keytruda   INTERVAL HISTORY:  Discussed the use of AI scribe software for clinical note transcription with the patient, who gave verbal consent to proceed.  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  She is undergoing chemotherapy with Taxol , reduced to 65 mg/m.  Today she is due for her fourth session which will include Taxol , Carbo, and Keytruda . She experiences no significant side effects such as fevers, chills, cough, shortness of breath, or peripheral neuropathy. Her ports function without issues.  Laboratory results show a blood sugar level of 156, slightly low calcium, normal white blood cell count and neutrophils, and hemoglobin at 10.6.  She reports no significant changes since starting treatment and feels well compared to others with similar conditions. Her activity level remains good, and she manages well with the current treatment, focusing on quality of life.     Patient Active Problem List   Diagnosis Date Noted   Genetic testing 04/24/2024   Family history of prostate cancer    Family history of ovarian cancer    Malignant neoplasm of overlapping sites of right breast in female, estrogen receptor negative (HCC) 04/04/2024    is allergic to penicillin g.  MEDICAL HISTORY: Past Medical History:  Diagnosis  Date   Anemia    Arthritis    Blood transfusion without reported diagnosis    Cancer (HCC)    Diabetes (HCC)    Family history of ovarian cancer    Family history of prostate cancer     Fibroid    Vaginal Pap smear, abnormal     SURGICAL HISTORY: Past Surgical History:  Procedure Laterality Date   ABDOMINAL HYSTERECTOMY     BREAST BIOPSY Right 03/28/2024   US  RT BREAST BX W LOC DEV 1ST LESION IMG BX SPEC US  GUIDE 03/28/2024 [facility]   BREAST BIOPSY Right 03/28/2024   US  RT BREAST BX W LOC DEV EA ADD LESION IMG BX SPEC US  GUIDE 03/28/2024 [facility]   PARTIAL HYSTERECTOMY     PORTACATH PLACEMENT Left 04/14/2024   Procedure: INSERTION, TUNNELED CENTRAL VENOUS DEVICE, WITH PORT;  Surgeon: Curvin Deward MOULD, MD;  Location: [facility];  Service: General;  Laterality: Left;  PORT PLACEMENT WITH ULTRASOUND GUIDANCE    SOCIAL HISTORY: Social History   Socioeconomic History   Marital status: Single    Spouse name: Not on file   Number of children: 0   Years of education: Not on file   Highest education level: Not on file  Occupational History   Not on file  Tobacco Use   Smoking status: Former    Current packs/day: 0.00    Types: Cigarettes    Quit date: 01/1996    Years since quitting: 28.3   Smokeless tobacco: Never  Vaping Use   Vaping status: Never Used  Substance and Sexual Activity   Alcohol  use: Never   Drug use: Never   Sexual activity: Not Currently  Other Topics Concern   Not on file  Social History Narrative   Not on file   Social Drivers of Health   Financial Resource Strain: Not on file  Food Insecurity: No Food Insecurity (04/05/2024)   Hunger Vital Sign    Worried About Running Out of Food in the Last Year: Never true    Ran Out of Food in the Last Year: Never true  Transportation Needs: No Transportation Needs (04/05/2024)   PRAPARE - Administrator, Civil Service (Medical): No    Lack of Transportation (Non-Medical): No  Physical Activity: Not on file  Stress: Not on file  Social Connections: Not on file  Intimate Partner Violence: Not At Risk (04/05/2024)   Humiliation, Afraid, Rape, and Kick  questionnaire    Fear of Current or Ex-Partner: No    Emotionally Abused: No    Physically Abused: No    Sexually Abused: No    FAMILY HISTORY: Family History  Problem Relation Age of Onset   Cancer Brother    Prostate cancer Brother    Ovarian cancer Maternal Aunt    Cervical cancer Maternal Aunt    Breast cancer Neg Hx     Review of Systems  Constitutional:  Negative for appetite change, chills, fatigue, fever and unexpected weight change.  HENT:   Negative for hearing loss, lump/mass and trouble swallowing.   Eyes:  Negative for eye problems and icterus.  Respiratory:  Negative for chest tightness, cough and shortness of breath.   Cardiovascular:  Negative for chest pain, leg swelling and palpitations.  Gastrointestinal:  Negative for abdominal distention, abdominal pain, constipation, diarrhea, nausea and vomiting.  Endocrine: Negative for hot flashes.  Genitourinary:  Negative for difficulty urinating.   Musculoskeletal:  Negative for arthralgias.  Skin:  Negative for itching and rash.  Neurological:  Negative for dizziness, extremity weakness, headaches and numbness.  Hematological:  Negative for adenopathy. Does not bruise/bleed easily.  Psychiatric/Behavioral:  Negative for depression. The patient is not nervous/anxious.       PHYSICAL EXAMINATION   Onc Performance Status - 05/09/24 1200       ECOG Perf Status   ECOG Perf Status Restricted in physically strenuous activity but ambulatory and able to carry out work of a light or sedentary nature, e.g., light house work, office work (P)       KPS SCALE   KPS % SCORE Able to carry on normal activity, minor s/s of disease (P)           Vitals:   05/09/24 1128  BP: 137/79  Pulse: 83  Resp: 16  Temp: 98 F (36.7 C)  SpO2: 100%    Physical Exam Constitutional:      General: She is not in acute distress.    Appearance: Normal appearance. She is not toxic-appearing.  HENT:     Head: Normocephalic and  atraumatic.     Mouth/Throat:     Mouth: Mucous membranes are moist.     Pharynx: Oropharynx is clear. No oropharyngeal exudate or posterior oropharyngeal erythema.  Eyes:     General: No scleral icterus. Cardiovascular:     Rate and Rhythm: Normal rate and regular rhythm.     Pulses: Normal pulses.     Heart sounds: Normal heart sounds.  Pulmonary:     Effort: Pulmonary effort is normal.     Breath sounds: Normal breath sounds.  Abdominal:     General: Abdomen is flat. Bowel sounds are normal. There is no distension.     Palpations: Abdomen is soft.     Tenderness: There is no abdominal tenderness.  Musculoskeletal:        General: No swelling.     Cervical back: Neck supple.  Lymphadenopathy:     Cervical: No cervical adenopathy.  Skin:    General: Skin is warm and dry.     Findings: No rash.  Neurological:     General: No focal deficit present.     Mental Status: She is alert.  Psychiatric:        Mood and Affect: Mood normal.        Behavior: Behavior normal.     LABORATORY DATA:  CBC    Component Value Date/Time   WBC 5.8 05/09/2024 1055   RBC 3.63 (L) 05/09/2024 1055   HGB 10.6 (L) 05/09/2024 1055   HCT 31.4 (L) 05/09/2024 1055   PLT 215 05/09/2024 1055   MCV 86.5 05/09/2024 1055   MCH 29.2 05/09/2024 1055   MCHC 33.8 05/09/2024 1055   RDW 13.0 05/09/2024 1055   LYMPHSABS 1.9 05/09/2024 1055   MONOABS 0.7 05/09/2024 1055   EOSABS 0.1 05/09/2024 1055   BASOSABS 0.1 05/09/2024 1055    CMP     Component Value Date/Time   NA 140 05/09/2024 1055   K 3.6 05/09/2024 1055   CL 110 05/09/2024 1055   CO2 26 05/09/2024 1055   GLUCOSE 156 (H) 05/09/2024 1055   BUN 11 05/09/2024 1055   CREATININE 0.63 05/09/2024 1055   CALCIUM 8.8 (L) 05/09/2024 1055   PROT 6.4 (L) 05/09/2024 1055   ALBUMIN 3.7 05/09/2024 1055   AST 12 (L) 05/09/2024 1055   ALT 9 05/09/2024 1055   ALKPHOS 64 05/09/2024 1055   BILITOT 0.3 05/09/2024 1055  GFRNONAA >60 05/09/2024 1055      ASSESSMENT and THERAPY PLAN:   Malignant neoplasm of overlapping sites of right breast in female, estrogen receptor negative (HCC) 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%    CT CAP 04/12/2024: Right breast mass and prominent right axillary lymph nodes, calcified omental nodules (probably granulomatous disease) peritoneal carcinomatosis not excluded.,  Left ovary lesion 3.8 cm Bone scan 04/13/2024: 2 foci of increased uptake in the cervical spine region indeterminate recommend MRI spine Breast MRI 04/13/2024: Right breast non-mass enhancement 9 cm, 3 abnormal right axillary lymph nodes   Treatment plan: Neoadjuvant chemotherapy with Taxol  carbo Keytruda  weekly x 12 followed by Adriamycin Cytoxan Keytruda  followed by Keytruda  maintenance Breast conserving surgery with ALND Adjuvant radiation therapy  GYN evaluation for the ovarian adnexal lesion: Pending Cervical spine MRI are pending -------------------------------------------------------------------------------------------------------------------- Current treatment: Taxol  carbo Keytruda  (started 04/19/2024) participation in S2205 clinical trial  Chemo toxicities: Assessment and Plan Assessment & Plan 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 Tolerating treatment well. Goal is curative with potential breast conservation. - Continue Taxol  and Keytruda  for 12 weeks. - Evaluate tumor response post-chemotherapy for surgical options. - Discuss surgical options with surgical team post-chemotherapy.  Chemotherapy-induced anemia Mild anemia with hemoglobin at 10.6 g/dL, expected to remain 0-89 g/dL. No transfusion needed. - Monitor hemoglobin levels closely during chemotherapy.  RTC in 1 week for labs, f.u, and next  treatment.    All questions were answered. The patient knows to call the clinic with any problems, questions or concerns. We can certainly see the patient much sooner if necessary.  Total encounter time:20 minutes*in face-to-face visit time, chart review, lab review, care coordination, order entry, and documentation of the encounter time.    [name], NP 05/09/24 4:21 PM Medical Oncology and Hematology [facility] [address] [place], [place] [address] Tel. [phone]    Fax. [phone]  *Total Encounter Time as defined by the Centers for Medicare and Medicaid Services includes, in addition to the face-to-face time of a patient visit (documented in the note above) non-face-to-face time: obtaining and reviewing outside history, ordering and reviewing medications, tests or procedures, care coordination (communications with other health care professionals or caregivers) and documentation in the medical record.

## 2024-05-09 NOTE — Patient Instructions (Signed)
 [facility] - A DEPT OF [facility]  Discharge Instructions: Thank you for choosing [facility] to provide your oncology and hematology care.   If you have a lab appointment with the Cancer Center, please go directly to the Cancer Center and check in at the registration area.   Wear comfortable clothing and clothing appropriate for easy access to any Portacath or PICC line.   We strive to give you quality time with your provider. You may need to reschedule your appointment if you arrive late (15 or more minutes).  Arriving late affects you and other patients whose appointments are after yours.  Also, if you miss three or more appointments without notifying the office, you may be dismissed from the clinic at the provider's discretion.      For prescription refill requests, have your pharmacy contact our office and allow 72 hours for refills to be completed.    Today you received the following chemotherapy and/or immunotherapy agents :  Pembrolizumab       To help prevent nausea and vomiting after your treatment, we encourage you to take your nausea medication as directed.  BELOW ARE SYMPTOMS THAT SHOULD BE REPORTED IMMEDIATELY: *FEVER GREATER THAN 100.4 F (38 C) OR HIGHER *CHILLS OR SWEATING *NAUSEA AND VOMITING THAT IS NOT CONTROLLED WITH YOUR NAUSEA MEDICATION *UNUSUAL SHORTNESS OF BREATH *UNUSUAL BRUISING OR BLEEDING *URINARY PROBLEMS (pain or burning when urinating, or frequent urination) *BOWEL PROBLEMS (unusual diarrhea, constipation, pain near the anus) TENDERNESS IN MOUTH AND THROAT WITH OR WITHOUT PRESENCE OF ULCERS (sore throat, sores in mouth, or a toothache) UNUSUAL RASH, SWELLING OR PAIN  UNUSUAL VAGINAL DISCHARGE OR ITCHING   Items with * indicate a potential emergency and should be followed up as soon as possible or go to the Emergency Department if any problems should occur.  Please show the CHEMOTHERAPY ALERT CARD or  IMMUNOTHERAPY ALERT CARD at check-in to the Emergency Department and triage nurse.  Should you have questions after your visit or need to cancel or reschedule your appointment, please contact [facility] - A DEPT [facility]  Dept: [phone]  and follow the prompts.  Office hours are 8:00 a.m. to 4:30 p.m. Monday - Friday. Please note that voicemails left after 4:00 p.m. may not be returned until the following business day.  We are closed weekends and major holidays. You have access to a nurse at all times for urgent questions. Please call the main number to the clinic Dept: [phone] and follow the prompts.   For any non-urgent questions, you may also contact your provider using MyChart. We now offer e-Visits for anyone 25 and older to request care online for non-urgent symptoms. For details visit [email or web address].   Also download the MyChart app! Go to the app store, search "MyChart", open the app, select [facility], and log in with your MyChart username and password.

## 2024-05-09 NOTE — Research (Unsigned)
 [name], ICE COMPRESS: RANDOMIZED TRIAL OF LIMB CRYOCOMPRESSION VERSUS CONTINUOUS COMPRESSION VERSUS LOW CYCLIC COMPRESSION FOR THE PREVENTION OF TAXANE-INDUCED PERIPHERAL NEUROPATHY    Week 4 Patient arrives today unaccompanied for the week 4 treatment. Confirmed patient does not have wounds, sores, or lesions to extremities. Patient has not had any vaccinations since last visit.   MD/PROVIDER VISIT: Patient sees [name], NP for today's visit.    ADVERSE EVENTS: Pt reports slight nail bed discoloration grade 1 since her last chemo treatment.  Solicited AEs reviewed with patient.  She denies any sensory or motor neuropathy symptoms. See AE table below    Adverse Event CTCAE Grade Onset date Resolved date Relationship to Study Intervention Action Taken Comments  Skin atrophy (solicited) 0            Skin hyperpigmentation (solicited) 0            Skin hypopigmentation (solicited) 0            Skin induration (solicited) 0            Skin ulceration (solicited) 0            Rash maculopapular (solicited) 0            Nail changes (solicited) 0            Cold intolerance (solicited) (general disorders and administration site conditions- other) 0            Frostbite (solicited) (skin and subcutaneous tissue disorders- other) 0            Nail discoloration (Non solicited)  1 05/09/24 Ongoing  None       STUDY INTERVENTION & TOLERABILITY ASSESSMENTS: 1:30 pm am Device wraps applied; pre-treatment started set on Arm 1 CyroCompression with temperature setting at 11 degree celsius and cyclic pressure 5-15 mm Hg.  1:37 pm Required 5-15 minute Tolerability check- pt states tolerable to cyrocompression with temperature setting at 11 degree celsius and cyclic pressure 5-15 mm Hg. 7:99 pm  Paxman machine shuts down and will not turn back on. Machine is changed out. Pt takes a bathroom break.  2:26 pm Paxman machine was exchanged and turns on. Wraps replaced and intervention restarted. Taxane  infusion started and moved to treatment phase on Paxman machine.  Cyrocompression with temperature setting at 11 degree celsius and cyclic pressure 5-15 mm Hg. 6:70 pmTaxane infusion end time. Moved to post-treatment phase. 3:59 pm Wraps removed. Intervention ended. during post treatment phase per protocol. Confirmed no new wounds, sores, or lesions.      The patient was thanked for their time and continued voluntary participation in this study. She understands research nurse will meet her again at next scheduled taxane infusion to apply study device. [name] has been provided direct contact information and is encouraged to contact this Nurse for any needs or question.   [name], RN, BSN Clinical Research Nurse [phone] 05/09/2024

## 2024-05-10 ENCOUNTER — Encounter: Payer: Self-pay | Admitting: Hematology and Oncology

## 2024-05-11 ENCOUNTER — Other Ambulatory Visit: Payer: Self-pay | Admitting: *Deleted

## 2024-05-11 ENCOUNTER — Other Ambulatory Visit: Payer: Self-pay

## 2024-05-11 ENCOUNTER — Ambulatory Visit (HOSPITAL_COMMUNITY)
Admission: RE | Admit: 2024-05-11 | Discharge: 2024-05-11 | Disposition: A | Discharge status: Home / Self Care | Source: Ambulatory Visit | Attending: Obstetrics and Gynecology | Admitting: Obstetrics and Gynecology

## 2024-05-11 ENCOUNTER — Encounter: Payer: Self-pay | Admitting: *Deleted

## 2024-05-11 DIAGNOSIS — C50811 Malignant neoplasm of overlapping sites of right female breast: Secondary | ICD-10-CM

## 2024-05-11 DIAGNOSIS — N838 Other noninflammatory disorders of ovary, fallopian tube and broad ligament: Secondary | ICD-10-CM | POA: Diagnosis present

## 2024-05-16 ENCOUNTER — Encounter: Payer: Self-pay | Admitting: Hematology and Oncology

## 2024-05-16 ENCOUNTER — Inpatient Hospital Stay

## 2024-05-16 ENCOUNTER — Encounter: Payer: Self-pay | Admitting: *Deleted

## 2024-05-16 ENCOUNTER — Inpatient Hospital Stay (HOSPITAL_BASED_OUTPATIENT_CLINIC_OR_DEPARTMENT_OTHER): Admitting: Hematology and Oncology

## 2024-05-16 VITALS — BP 133/77 | HR 94 | Temp 98.5°F | Resp 18 | Wt 140.5 lb

## 2024-05-16 DIAGNOSIS — Z171 Estrogen receptor negative status [ER-]: Secondary | ICD-10-CM

## 2024-05-16 DIAGNOSIS — C50811 Malignant neoplasm of overlapping sites of right female breast: Secondary | ICD-10-CM

## 2024-05-16 DIAGNOSIS — Z5111 Encounter for antineoplastic chemotherapy: Secondary | ICD-10-CM | POA: Diagnosis not present

## 2024-05-16 LAB — CMP (CANCER CENTER ONLY)
ALT: 18 U/L (ref 0–44)
AST: 14 U/L — ABNORMAL LOW (ref 15–41)
Albumin: 4.1 g/dL (ref 3.5–5.0)
Alkaline Phosphatase: 50 U/L (ref 38–126)
Anion gap: 4 — ABNORMAL LOW (ref 5–15)
BUN: 18 mg/dL (ref 8–23)
CO2: 24 mmol/L (ref 22–32)
Calcium: 8.9 mg/dL (ref 8.9–10.3)
Chloride: 110 mmol/L (ref 98–111)
Creatinine: 0.74 mg/dL (ref 0.44–1.00)
GFR, Estimated: >60 mL/min (ref >60)
Glucose, Bld: 152 mg/dL — ABNORMAL HIGH (ref 70–99)
Potassium: 4 mmol/L (ref 3.5–5.1)
Sodium: 138 mmol/L (ref 135–145)
Total Bilirubin: 0.4 mg/dL (ref 0.0–1.2)
Total Protein: 6.7 g/dL (ref 6.5–8.1)

## 2024-05-16 LAB — CBC WITH DIFFERENTIAL (CANCER CENTER ONLY)
Abs Immature Granulocytes: 0.02 K/uL (ref 0.00–0.07)
Basophils Absolute: 0.1 K/uL (ref 0.0–0.1)
Basophils Relative: 1 %
Eosinophils Absolute: 0.1 K/uL (ref 0.0–0.5)
Eosinophils Relative: 1 %
HCT: 32.6 % — ABNORMAL LOW (ref 36.0–46.0)
Hemoglobin: 11 g/dL — ABNORMAL LOW (ref 12.0–15.0)
Immature Granulocytes: 0 %
Lymphocytes Relative: 30 %
Lymphs Abs: 1.6 K/uL (ref 0.7–4.0)
MCH: 29.3 pg (ref 26.0–34.0)
MCHC: 33.7 g/dL (ref 30.0–36.0)
MCV: 86.7 fL (ref 80.0–100.0)
Monocytes Absolute: 0.3 K/uL (ref 0.1–1.0)
Monocytes Relative: 5 %
Neutro Abs: 3.5 K/uL (ref 1.7–7.7)
Neutrophils Relative %: 63 %
Platelet Count: 270 K/uL (ref 150–400)
RBC: 3.76 MIL/uL — ABNORMAL LOW (ref 3.87–5.11)
RDW: 13.3 % (ref 11.5–15.5)
WBC Count: 5.5 K/uL (ref 4.0–10.5)
nRBC: 0 % (ref 0.0–0.2)

## 2024-05-16 MED ORDER — SODIUM CHLORIDE 0.9 % IV SOLN
65.0000 mg/m2 | Freq: Once | INTRAVENOUS | Status: AC
  Administered 2024-05-16: 114 mg via INTRAVENOUS
  Filled 2024-05-16: qty 19

## 2024-05-16 MED ORDER — SODIUM CHLORIDE 0.9 % IV SOLN: INTRAVENOUS | Status: DC

## 2024-05-16 MED ORDER — SODIUM CHLORIDE 0.9 % IV SOLN
117.9000 mg | Freq: Once | INTRAVENOUS | Status: AC
  Administered 2024-05-16: 120 mg via INTRAVENOUS
  Filled 2024-05-16: qty 12

## 2024-05-16 MED ORDER — PALONOSETRON HCL INJECTION 0.25 MG/5ML
0.2500 mg | Freq: Once | INTRAVENOUS | Status: AC
  Administered 2024-05-16: 0.25 mg via INTRAVENOUS
  Filled 2024-05-16: qty 5

## 2024-05-16 MED ORDER — DIPHENHYDRAMINE HCL 50 MG/ML IJ SOLN
50.0000 mg | Freq: Once | INTRAMUSCULAR | Status: AC
  Administered 2024-05-16: 50 mg via INTRAVENOUS
  Filled 2024-05-16: qty 1

## 2024-05-16 MED ORDER — FAMOTIDINE IN NACL 20-0.9 MG/50ML-% IV SOLN
20.0000 mg | Freq: Once | INTRAVENOUS | Status: AC
  Administered 2024-05-16: 20 mg via INTRAVENOUS
  Filled 2024-05-16: qty 50

## 2024-05-16 MED ORDER — DEXAMETHASONE SOD PHOSPHATE PF 10 MG/ML IJ SOLN
10.0000 mg | Freq: Once | INTRAMUSCULAR | Status: AC
  Administered 2024-05-16: 10 mg via INTRAVENOUS

## 2024-05-16 NOTE — Progress Notes (Signed)
 Patient Care Team: [name], MD as PCP - General (Family Medicine) Gerome, Devere HERO, RN as Oncology Nurse Navigator Tyree Nanetta SAILOR, RN as Oncology Nurse Navigator Odean Potts, MD as Consulting Physician (Hematology and Oncology) Dewey Rush, MD as Consulting Physician (Radiation Oncology) Curvin Deward MOULD, MD as Consulting Physician (General Surgery)  DIAGNOSIS:  Encounter Diagnosis  Name Primary?   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  The report date is April 19, 2024.  The CancerNext-Expanded gene panel offered by [facility] and includes sequencing, rearrangement, and RNA analysis for the following 77 genes: AIP, ALK, APC, ATM, BAP1, BARD1, BMPR1A, BRCA1, BRCA2, BRIP1, CDC73, CDH1, CDK4, CDKN1B, CDKN2A, CEBPA, CHEK2, CTNNA1, DDX41, DICER1, ETV6, FH, FLCN, GATA2, LZTR1, MAX, MBD4, MEN1, MET, MLH1, MSH2, MSH3, MSH6, MUTYH, NF1, NF2, NTHL1, PALB2, PHOX2B, PMS2, POT1, PRKAR1A, PTCH1, PTEN, RAD51C, RAD51D, RB1,  RET, RPS20, RUNX1, SDHA, SDHAF2, SDHB, SDHC, SDHD, SMAD4, SMARCA4, SMARCB1, SMARCE1, STK11, SUFU, TMEM127, TP53, TSC1, TSC2, VHL, and WT1 (sequencing and deletion/duplication); AXIN2, CTNNA1, DDX41, EGFR, HOXB13, KIT, MBD4, MITF, MSH3, PDGFRA, POLD1 and POLE (sequencing only); EPCAM and GREM1 (deletion/duplication only). RNA data is routinely analyzed for use in variant interpretation for all genes.      CHIEF COMPLIANT: Cycle 5 Taxol  carbo Keytruda   HISTORY OF PRESENT ILLNESS:  History of Present Illness Christy Wiley is a 73 year old female undergoing chemotherapy who presents for follow-up regarding her treatment response.  She feels good overall with no significant side effects from chemotherapy. She experiences no nausea, neuropathy, numbness, or tingling. Her energy levels are decent, and her taste is intact, though there may be a decrease in her sense of smell.  She is concerned about her nail beds, noting darkness at the bottom and potential lifting of the nails.     ALLERGIES:  is allergic to penicillin g.  MEDICATIONS:  Current Outpatient Medications  Medication Sig Dispense Refill   atorvastatin (LIPITOR) 40 MG tablet Take 40 mg by mouth.     lidocaine -prilocaine  (EMLA ) cream Apply to affected area once 30 g 3   lisinopril (ZESTRIL) 5 MG tablet Take 5 mg by mouth.     meloxicam (MOBIC) 7.5 MG tablet Take 7.5 mg by mouth daily as needed.     ondansetron  (ZOFRAN ) 8 MG tablet Take 1 tablet (8 mg total) by mouth every 8 (eight) hours as needed for nausea or vomiting. Start on the third day after chemotherapy. 30 tablet 1   oxyCODONE  (ROXICODONE ) 5 MG immediate release tablet Take 1 tablet (5  mg total) by mouth every 6 (six) hours as needed. 10 tablet 0   pantoprazole  (PROTONIX ) 40 MG tablet Take 1 tablet (40 mg total) by mouth daily. 30 tablet 3   prochlorperazine  (COMPAZINE ) 10 MG tablet Take 1 tablet (10 mg total) by mouth every 6 (six) hours as needed for nausea or vomiting.  30 tablet 1   Tirzepatide (MOUNJARO [place]) Inject into the skin.     No current facility-administered medications for this visit.    PHYSICAL EXAMINATION: ECOG PERFORMANCE STATUS: 1 - Symptomatic but completely ambulatory  Vitals:   05/16/24 1325  BP: 133/77  Pulse: 94  Resp: 18  Temp: 98.5 F (36.9 C)  SpO2: 100%   Filed Weights   05/16/24 1325  Weight: 140 lb 8 oz (63.7 kg)   LABORATORY DATA:  I have reviewed the data as listed    Latest Ref Rng & Units 05/09/2024   10:55 AM 05/02/2024   11:23 AM 04/25/2024   11:28 AM  CMP  Glucose 70 - 99 mg/dL 843  813  856   BUN 8 - 23 mg/dL 11  9  13    Creatinine 0.44 - 1.00 mg/dL 9.36  9.32  9.24   Sodium 135 - 145 mmol/L 140  137  140   Potassium 3.5 - 5.1 mmol/L 3.6  4.1  4.1   Chloride 98 - 111 mmol/L 110  108  109   CO2 22 - 32 mmol/L 26  25  27    Calcium 8.9 - 10.3 mg/dL 8.8  8.2  8.6   Total Protein 6.5 - 8.1 g/dL 6.4  6.4  6.7   Total Bilirubin 0.0 - 1.2 mg/dL 0.3  0.3  0.4   Alkaline Phos 38 - 126 U/L 64  60  51   AST 15 - 41 U/L 12  12  13    ALT 0 - 44 U/L 9  10  11      Lab Results  Component Value Date   WBC 5.5 05/16/2024   HGB 11.0 (L) 05/16/2024   HCT 32.6 (L) 05/16/2024   MCV 86.7 05/16/2024   PLT 270 05/16/2024   NEUTROABS 3.5 05/16/2024    ASSESSMENT & PLAN:  Malignant neoplasm of overlapping sites of right breast in female, estrogen receptor negative (HCC) 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%    CT CAP 04/12/2024: Right breast mass and prominent right axillary lymph nodes, calcified omental nodules (probably granulomatous disease) peritoneal carcinomatosis not excluded.,  Left ovary lesion 3.8 cm Bone scan 04/13/2024: 2 foci of increased uptake in the cervical spine region indeterminate recommend MRI spine Breast MRI 04/13/2024: Right breast non-mass enhancement 9  cm, 3 abnormal right axillary lymph nodes   Treatment plan: Neoadjuvant chemotherapy with Taxol  carbo Keytruda  weekly x 12 followed by Adriamycin Cytoxan Keytruda  followed by Keytruda  maintenance Breast conserving surgery with ALND Adjuvant radiation therapy   GYN evaluation for the ovarian adnexal lesion: Status post pelvic ultrasound.  Results are not available. -------------------------------------------------------------------------------------------------------------------- Current treatment: Cycle 5 Taxol  carbo Keytruda  (started 04/19/2024) participation in S2205 clinical trial  Chemo toxicities: Fatigue Slight leukopenia: I reduced the dosage of Taxol  Nailbed discoloration   Otherwise tolerated chemo fairly well. Return to clinic weekly for chemo and every other week for follow-up with me  No orders of the defined types were placed in this encounter.  The patient has  a good understanding of the overall plan. she agrees with it. she will call with any problems that may develop before the next visit here.  I personally spent a total of 30 minutes in the care of the patient today including preparing to see the patient, getting/reviewing separately obtained history, performing a medically appropriate exam/evaluation, counseling and educating, placing orders, referring and communicating with other health care professionals, documenting clinical information in the EHR, independently interpreting results, communicating results, and coordinating care.   Viinay K Tocara Mennen, MD 05/16/24

## 2024-05-16 NOTE — Research (Unsigned)
 [name], ICE COMPRESS: RANDOMIZED TRIAL OF LIMB CRYOCOMPRESSION VERSUS CONTINUOUS COMPRESSION VERSUS LOW CYCLIC COMPRESSION FOR THE PREVENTION OF TAXANE-INDUCED PERIPHERAL NEUROPATHY    Week 5 Patient arrives today unaccompanied for the week 5 treatment. Confirmed patient does not have wounds, sores, or lesions to extremities. Patient has not had any vaccinations since last visit.   MD/PROVIDER VISIT: Patient sees Dr.Gudena for today's visit.    ADVERSE EVENTS:   Solicited AEs reviewed with patient.  She denies any sensory or motor neuropathy symptoms. See AE table below     Adverse Event CTCAE Grade Onset date Resolved date Relationship to Study Intervention Action Taken Comments  Skin atrophy (solicited) 0            Skin hyperpigmentation (solicited) 0            Skin hypopigmentation (solicited) 0            Skin induration (solicited) 0            Skin ulceration (solicited) 0            Rash maculopapular (solicited) 0            Nail changes (solicited) 0            Cold intolerance (solicited) (general disorders and administration site conditions- other) 0            Frostbite (solicited) (skin and subcutaneous tissue disorders- other) 0            Nail discoloration (Non solicited)  1 05/09/24 Ongoing   None        STUDY INTERVENTION & TOLERABILITY ASSESSMENTS: 2:46 pm am Device wraps applied; pre-treatment started set on Arm 1 CyroCompression with temperature setting at 11 degree celsius and cyclic pressure 5-15 mm Hg.  2:56 pm Required 5-15 minute Tolerability check- pt states tolerable to cyrocompression with temperature setting at 11 degree celsius and cyclic pressure 5-15 mm Hg. 6:79 pm Taxane infusion started and moved to treatment phase on Paxman machine.  Cyrocompression with temperature setting at 11 degree celsius and cyclic pressure 5-15 mm Hg. 5:73 pmTaxane infusion end time. Moved to post-treatment phase.  pm Wraps removed. Intervention ended. during post treatment  phase per protocol. Confirmed no new wounds, sores, or lesions.      The patient was thanked for their time and continued voluntary participation in this study. She understands research nurse will meet her again at next scheduled taxane infusion to apply study device. [name] has been provided direct contact information and is encouraged to contact this Nurse for any needs or question.   Christy Larsen, RN, BSN Clinical Research Nurse [phone] 05/16/2024

## 2024-05-16 NOTE — Assessment & Plan Note (Signed)
 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%    CT CAP 04/12/2024: Right breast mass and prominent right axillary lymph nodes, calcified omental nodules (probably granulomatous disease) peritoneal carcinomatosis not excluded.,  Left ovary lesion 3.8 cm Bone scan 04/13/2024: 2 foci of increased uptake in the cervical spine region indeterminate recommend MRI spine Breast MRI 04/13/2024: Right breast non-mass enhancement 9 cm, 3 abnormal right axillary lymph nodes   Treatment plan: Neoadjuvant chemotherapy with Taxol  carbo Keytruda  weekly x 12 followed by Adriamycin Cytoxan Keytruda  followed by Keytruda  maintenance Breast conserving surgery with ALND Adjuvant radiation therapy   GYN evaluation for the ovarian adnexal lesion: Pending Cervical spine MRI are pending -------------------------------------------------------------------------------------------------------------------- Current treatment: Cycle 5 Taxol  carbo Keytruda  (started 04/19/2024) participation in S2205 clinical trial  Chemo toxicities: Fatigue Slight leukopenia: I reduced the dosage of Taxol    Otherwise tolerated chemo fairly well. Return to clinic weekly for chemo and every other week for follow-up with me

## 2024-05-17 ENCOUNTER — Encounter: Payer: Self-pay | Admitting: Hematology and Oncology

## 2024-05-17 ENCOUNTER — Ambulatory Visit: Payer: Self-pay | Admitting: Obstetrics and Gynecology

## 2024-05-18 NOTE — Progress Notes (Signed)
 GYNECOLOGIC ONCOLOGY NEW PATIENT CONSULTATION   Patient Name: Christy Wiley  Patient Age: 73 y.o. Date of Service: 05/19/24 Referring Provider: Rollo Bring, MD  Primary Care Provider: [name], MD Consulting Provider: Comer Dollar, MD   Assessment/Plan:  Postmenopausal with complex adnexal mass.  The patient is currently on chemotherapy for breast cancer.  She is receiving carboplatin  and paclitaxel  with immunotherapy.   Luckily, in terms of her adnexal mass, she is completely asymptomatic. Reviewed imaging findings which show a partially solid adnexal mass.  Reviewed CT imaging together.  Discussed some imaging findings on ultrasound that raise the concern for possible borderline or malignant process with recommendation for further characterization by pelvic MRI.  Also discussed utility of tumor markers, but I stressed that these are not diagnostic.  Results may help with timing and planning of surgery.  The patient's preference would be to avoid having surgery if possible.  We discussed that for the majority of ovarian cancer if upfront chemotherapy is indicated (either neoadjuvant or adjuvant), carboplatin  and paclitaxel  is typically given.  In an effort not to delay her breast cancer treatment, I would advocate to finish her neoadjuvant chemotherapy and plan for possible surgery, depending on MRI results, around that time or at the time of her breast surgery.  The patient is somewhat worried about cost of MRI.  She was amenable to working to get this scheduled and having prior authorization done.  If cost is prohibitive, we will plan to repeat a pelvic ultrasound after she finishes chemotherapy.  If imaging findings, either by pelvic ultrasound or MRI are concerning for borderline process or malignancy, then we would work to schedule surgery ideally after she finishes chemotherapy and around the time or at the same time as her breast surgery.  The patient's preference was to have  tumor markers drawn at the next time that she has labs for chemotherapy.  A copy of this note was sent to the patient's referring provider.   65 minutes of total time was spent for this patient encounter, including preparation, face-to-face counseling with the patient and coordination of care, and documentation of the encounter.  Comer Dollar, MD  Division of Gynecologic Oncology  Department of Obstetrics and Gynecology  [facility]  ___________________________________________  Chief Complaint: Chief Complaint  Patient presents with   Pelvic mass    History of Present Illness:  Christy Wiley is a 73 y.o. y.o. female who is seen in consultation at the request of Dr. Bring for an evaluation of complex adnexal mass.  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  CT of the chest, abdomen, and pelvis on 04/12/2024 for staging purposes for new diagnosis of breast cancer shows mass in the lateral right breast, asymmetric prominent right axillary lymph nodes consistent with biopsy-proven nodal metastatic disease.  Multiple densely calcified omental nodules and calcified nodules scattered throughout the low pelvis, nonspecific and may represent prior granulomatous disease however treated peritoneal carcinomatosis not excluded.  Low intermediate attenuation lesion of the left ovary or adnexa measures up to 3.8 cm.  Incompletely characterized.  She began neoadjuvant chemotherapy with pembrolizumab , carboplatin , and paclitaxel  in mid September.  Germline genetic testing on 9/17 is negative for pathogenic mutation.  Pelvic ultrasound on 05/11/2024: Uterus is surgically absent.  Right ovary not visualized.  3.5 x 3.3 x 2.4 cm ovoid homogeneous solid-appearing isoechoic mass within the left adnexa with mild internal vascularity.  Adjacent to abutting left ovary measures up  to 1.3 cm.  Further characterization with MRI  encouraged.  Today, the patient notes doing very well.  Other than alopecia and some nail changes, she denies significant side effects related to chemotherapy.  She endorses a good appetite without nausea or emesis.  Having some intermittent constipation, uses magnesium citrate with good results.  Has a little burning on the outside when she voids after chemotherapy, otherwise denies any urinary symptoms.  Denies any pelvic or abdominal pain.  PAST MEDICAL HISTORY:  Past Medical History:  Diagnosis Date   Anemia    Arthritis    Blood transfusion without reported diagnosis    Cancer (HCC)    Diabetes (HCC)    Family history of ovarian cancer    Family history of prostate cancer    Fibroid    Vaginal Pap smear, abnormal      PAST SURGICAL HISTORY:  Past Surgical History:  Procedure Laterality Date   ABDOMINAL HYSTERECTOMY     fibroids, left adnexa   BREAST BIOPSY Right 03/28/2024   US  RT BREAST BX W LOC DEV 1ST LESION IMG BX SPEC US  GUIDE 03/28/2024 [facility]   BREAST BIOPSY Right 03/28/2024   US  RT BREAST BX W LOC DEV EA ADD LESION IMG BX SPEC US  GUIDE 03/28/2024 [facility]   PORTACATH PLACEMENT Left 04/14/2024   Procedure: INSERTION, TUNNELED CENTRAL VENOUS DEVICE, WITH PORT;  Surgeon: Curvin Deward MOULD, MD;  Location: [facility];  Service: General;  Laterality: Left;  PORT PLACEMENT WITH ULTRASOUND GUIDANCE    OB/GYN HISTORY:  OB History  Gravida Para Term Preterm AB Living  1    1 0  SAB IAB Ectopic Multiple Live Births          # Outcome Date GA Lbr Len/2nd Weight Sex Type Anes PTL Lv  1 AB             No LMP recorded. Patient has had a hysterectomy.  Age at menarche: 36  Age at menopause: unsure - hysterectomy at 10 Hx of HRT: denies Hx of STDs: denies Last pap: 1988 History of abnormal pap smears: denies  SCREENING STUDIES:  Last mammogram: 2025  Last colonoscopy: 2023  MEDICATIONS: Outpatient Encounter Medications as of  05/19/2024  Medication Sig   atorvastatin (LIPITOR) 40 MG tablet Take 40 mg by mouth.   lidocaine -prilocaine  (EMLA ) cream Apply to affected area once   lisinopril (ZESTRIL) 5 MG tablet Take 5 mg by mouth.   meloxicam (MOBIC) 7.5 MG tablet Take 7.5 mg by mouth daily as needed.   ondansetron  (ZOFRAN ) 8 MG tablet Take 1 tablet (8 mg total) by mouth every 8 (eight) hours as needed for nausea or vomiting. Start on the third day after chemotherapy.   oxyCODONE  (ROXICODONE ) 5 MG immediate release tablet Take 1 tablet (5 mg total) by mouth every 6 (six) hours as needed.   pantoprazole  (PROTONIX ) 40 MG tablet Take 1 tablet (40 mg total) by mouth daily.   prochlorperazine  (COMPAZINE ) 10 MG tablet Take 1 tablet (10 mg total) by mouth every 6 (six) hours as needed for nausea or vomiting.   Tirzepatide (MOUNJARO [place]) Inject into the skin.   No facility-administered encounter medications on file as of 05/19/2024.    ALLERGIES:  Allergies  Allergen Reactions   Penicillin G Other (See Comments)     FAMILY HISTORY:  Family History  Problem Relation Age of Onset   Cancer Brother    Prostate cancer Brother    Ovarian cancer Maternal  Aunt    Cervical cancer Maternal Aunt    Breast cancer Neg Hx      SOCIAL HISTORY:  Social Connections: Not on file    REVIEW OF SYSTEMS:  Denies appetite changes, fevers, chills, fatigue, unexplained weight changes. Denies hearing loss, neck lumps or masses, mouth sores, ringing in ears or voice changes. Denies cough or wheezing.  Denies shortness of breath. Denies chest pain or palpitations. Denies leg swelling. Denies abdominal distention, pain, blood in stools, constipation, diarrhea, nausea, vomiting, or early satiety. Denies pain with intercourse, dysuria, frequency, hematuria or incontinence. Denies hot flashes, pelvic pain, vaginal bleeding or vaginal discharge.   Denies joint pain, back pain or muscle pain/cramps. Denies itching, rash, or wounds. Denies  dizziness, headaches, numbness or seizures. Denies swollen lymph nodes or glands, denies easy bruising or bleeding. Denies anxiety, depression, confusion, or decreased concentration.  Physical Exam:  Vital Signs for this encounter:  Blood pressure 126/71, pulse 85, temperature 98.9 F (37.2 C), temperature source Oral, resp. rate 19, height 5' 3 (1.6 m), weight 141 lb 3.2 oz (64 kg), SpO2 100%. Body mass index is 25.01 kg/m. General: Alert, oriented, no acute distress.  HEENT: Normocephalic, atraumatic. Sclera anicteric.  Chest: Clear to auscultation bilaterally. No wheezes, rhonchi, or rales. Cardiovascular: Regular rate and rhythm, no murmurs, rubs, or gallops.  Abdomen: Normoactive bowel sounds. Soft, nondistended, nontender to palpation. No masses or hepatosplenomegaly appreciated. No palpable fluid wave.  Extremities: Grossly normal range of motion. Warm, well perfused. No edema bilaterally.  Skin: No rashes or lesions.  Lymphatics: No cervical, supraclavicular, or inguinal adenopathy.  GU:  Normal external female genitalia. No lesions. No discharge or bleeding.             Bladder/urethra:  No lesions or masses, well supported bladder             Vagina: Mildly atrophic, no lesions.             Cervix/uterus: surgically absent             Adnexa: Inferior aspect of a mass in the left adnexa is palpated, approximately 2-3 cm, quite mobile, firm but not nodular.  No pain with palpation in either adnexal region.  Rectal: Deferred.  LABORATORY AND RADIOLOGIC DATA:  Outside medical records were reviewed to synthesize the above history, along with the history and physical obtained during the visit.   Lab Results  Component Value Date   WBC 5.5 05/16/2024   HGB 11.0 (L) 05/16/2024   HCT 32.6 (L) 05/16/2024   PLT 270 05/16/2024   GLUCOSE 152 (H) 05/16/2024   ALT 18 05/16/2024   AST 14 (L) 05/16/2024   NA 138 05/16/2024   K 4.0 05/16/2024   CL 110 05/16/2024   CREATININE 0.74  05/16/2024   BUN 18 05/16/2024   CO2 24 05/16/2024   TSH 0.359 04/18/2024

## 2024-05-19 ENCOUNTER — Inpatient Hospital Stay: Admitting: Gynecologic Oncology

## 2024-05-19 ENCOUNTER — Encounter: Payer: Self-pay | Admitting: Gynecologic Oncology

## 2024-05-19 VITALS — BP 126/71 | HR 85 | Temp 98.9°F | Resp 19 | Ht 63.0 in | Wt 141.2 lb

## 2024-05-19 DIAGNOSIS — Z171 Estrogen receptor negative status [ER-]: Secondary | ICD-10-CM | POA: Diagnosis not present

## 2024-05-19 DIAGNOSIS — Z8041 Family history of malignant neoplasm of ovary: Secondary | ICD-10-CM

## 2024-05-19 DIAGNOSIS — C50811 Malignant neoplasm of overlapping sites of right female breast: Secondary | ICD-10-CM | POA: Diagnosis not present

## 2024-05-19 DIAGNOSIS — R1900 Intra-abdominal and pelvic swelling, mass and lump, unspecified site: Secondary | ICD-10-CM

## 2024-05-19 NOTE — Patient Instructions (Signed)
 It was nice to meet you today. I will let you know when I get your labs back from today. WE will plan on a repeat ultrasound when you finish chemotherapy if MRI is too expensive.

## 2024-05-23 ENCOUNTER — Inpatient Hospital Stay

## 2024-05-23 ENCOUNTER — Inpatient Hospital Stay (HOSPITAL_BASED_OUTPATIENT_CLINIC_OR_DEPARTMENT_OTHER): Admitting: Hematology and Oncology

## 2024-05-23 ENCOUNTER — Encounter: Payer: Self-pay | Admitting: *Deleted

## 2024-05-23 VITALS — BP 133/78 | HR 80 | Temp 97.3°F | Resp 17 | Ht 63.0 in | Wt 142.7 lb

## 2024-05-23 VITALS — HR 84 | Temp 97.8°F | Resp 16

## 2024-05-23 DIAGNOSIS — Z171 Estrogen receptor negative status [ER-]: Secondary | ICD-10-CM

## 2024-05-23 DIAGNOSIS — C50811 Malignant neoplasm of overlapping sites of right female breast: Secondary | ICD-10-CM

## 2024-05-23 DIAGNOSIS — Z5111 Encounter for antineoplastic chemotherapy: Secondary | ICD-10-CM | POA: Diagnosis not present

## 2024-05-23 DIAGNOSIS — R1900 Intra-abdominal and pelvic swelling, mass and lump, unspecified site: Secondary | ICD-10-CM

## 2024-05-23 LAB — CMP (CANCER CENTER ONLY)
ALT: 11 U/L (ref 0–44)
AST: 11 U/L — ABNORMAL LOW (ref 15–41)
Albumin: 3.9 g/dL (ref 3.5–5.0)
Alkaline Phosphatase: 49 U/L (ref 38–126)
Anion gap: 4 — ABNORMAL LOW (ref 5–15)
BUN: 15 mg/dL (ref 8–23)
CO2: 25 mmol/L (ref 22–32)
Calcium: 9.2 mg/dL (ref 8.9–10.3)
Chloride: 111 mmol/L (ref 98–111)
Creatinine: 0.64 mg/dL (ref 0.44–1.00)
GFR, Estimated: >60 mL/min (ref >60)
Glucose, Bld: 138 mg/dL — ABNORMAL HIGH (ref 70–99)
Potassium: 3.9 mmol/L (ref 3.5–5.1)
Sodium: 140 mmol/L (ref 135–145)
Total Bilirubin: 0.4 mg/dL (ref 0.0–1.2)
Total Protein: 6.5 g/dL (ref 6.5–8.1)

## 2024-05-23 LAB — CBC WITH DIFFERENTIAL (CANCER CENTER ONLY)
Abs Immature Granulocytes: 0.02 K/uL (ref 0.00–0.07)
Basophils Absolute: 0 K/uL (ref 0.0–0.1)
Basophils Relative: 1 %
Eosinophils Absolute: 0.1 K/uL (ref 0.0–0.5)
Eosinophils Relative: 2 %
HCT: 31.7 % — ABNORMAL LOW (ref 36.0–46.0)
Hemoglobin: 10.6 g/dL — ABNORMAL LOW (ref 12.0–15.0)
Immature Granulocytes: 0 %
Lymphocytes Relative: 27 %
Lymphs Abs: 1.3 K/uL (ref 0.7–4.0)
MCH: 29.5 pg (ref 26.0–34.0)
MCHC: 33.4 g/dL (ref 30.0–36.0)
MCV: 88.3 fL (ref 80.0–100.0)
Monocytes Absolute: 0.3 K/uL (ref 0.1–1.0)
Monocytes Relative: 7 %
Neutro Abs: 3 K/uL (ref 1.7–7.7)
Neutrophils Relative %: 63 %
Platelet Count: 300 K/uL (ref 150–400)
RBC: 3.59 MIL/uL — ABNORMAL LOW (ref 3.87–5.11)
RDW: 14.4 % (ref 11.5–15.5)
WBC Count: 4.8 K/uL (ref 4.0–10.5)
nRBC: 0 % (ref 0.0–0.2)

## 2024-05-23 MED ORDER — SODIUM CHLORIDE 0.9 % IV SOLN
65.0000 mg/m2 | Freq: Once | INTRAVENOUS | Status: AC
  Administered 2024-05-23: 114 mg via INTRAVENOUS
  Filled 2024-05-23: qty 19

## 2024-05-23 MED ORDER — SODIUM CHLORIDE 0.9 % IV SOLN
117.9000 mg | Freq: Once | INTRAVENOUS | Status: AC
  Administered 2024-05-23: 120 mg via INTRAVENOUS
  Filled 2024-05-23: qty 12

## 2024-05-23 MED ORDER — FAMOTIDINE IN NACL 20-0.9 MG/50ML-% IV SOLN
20.0000 mg | Freq: Once | INTRAVENOUS | Status: AC
  Administered 2024-05-23: 20 mg via INTRAVENOUS
  Filled 2024-05-23: qty 50

## 2024-05-23 MED ORDER — DIPHENHYDRAMINE HCL 50 MG/ML IJ SOLN
50.0000 mg | Freq: Once | INTRAMUSCULAR | Status: AC
  Administered 2024-05-23: 50 mg via INTRAVENOUS
  Filled 2024-05-23: qty 1

## 2024-05-23 MED ORDER — SODIUM CHLORIDE 0.9 % IV SOLN: INTRAVENOUS | Status: DC

## 2024-05-23 MED ORDER — PALONOSETRON HCL INJECTION 0.25 MG/5ML
0.2500 mg | Freq: Once | INTRAVENOUS | Status: AC
  Administered 2024-05-23: 0.25 mg via INTRAVENOUS
  Filled 2024-05-23: qty 5

## 2024-05-23 MED ORDER — DEXAMETHASONE SOD PHOSPHATE PF 10 MG/ML IJ SOLN
10.0000 mg | Freq: Once | INTRAMUSCULAR | Status: AC
  Administered 2024-05-23: 10 mg via INTRAVENOUS

## 2024-05-23 NOTE — Research (Unsigned)
 [name], ICE COMPRESS: RANDOMIZED TRIAL OF LIMB CRYOCOMPRESSION VERSUS CONTINUOUS COMPRESSION VERSUS LOW CYCLIC COMPRESSION FOR THE PREVENTION OF TAXANE-INDUCED PERIPHERAL NEUROPATHY    Week 6 Patient arrives today unaccompanied for the week 6 treatment. Confirmed patient does not have wounds, sores, or lesions to extremities. Patient has not had any vaccinations since last visit.   MD/PROVIDER VISIT: Patient sees Dr.Gudena for today's visit.    ADVERSE EVENTS:   Solicited AEs reviewed with patient.  She denies any sensory or motor neuropathy symptoms. See AE table below     Adverse Event CTCAE Grade Onset date Resolved date Relationship to Study Intervention Action Taken Comments  Skin atrophy (solicited) 0            Skin hyperpigmentation (solicited) 0            Skin hypopigmentation (solicited) 0            Skin induration (solicited) 0            Skin ulceration (solicited) 0            Rash maculopapular (solicited) 0            Nail changes (solicited) 0            Cold intolerance (solicited) (general disorders and administration site conditions- other) 0            Frostbite (solicited) (skin and subcutaneous tissue disorders- other) 0            Nail discoloration (Non solicited)  1 05/09/24 Ongoing   None        STUDY INTERVENTION & TOLERABILITY ASSESSMENTS: 2:05 pm am Device wraps applied; pre-treatment started set on Arm 1 CyroCompression with temperature setting at 11 degree celsius and cyclic pressure 5-15 mm Hg.  2:11 pm Required 5-15 minute Tolerability check- pt states tolerable to cyrocompression with temperature setting at 11 degree celsius and cyclic pressure 5-15 mm Hg. 7:57 pm Taxane infusion started and moved to treatment phase on Paxman machine.  Cyrocompression with temperature setting at 11 degree celsius and cyclic pressure 5-15 mm Hg. 6:74 pm Bathroom break. Wraps removed 3:31 pm Wraps replaced and intervention restarted.  3:52 pmTaxane infusion end time.  Moved to post-treatment phase. 4:28 pm Wraps removed. Intervention ended. Pt made up bathroom break (6 minutes total) during post treatment phase per protocol. Confirmed no new wounds, sores, or lesions.      The patient was thanked for their time and continued voluntary participation in this study. She understands research nurse will meet her again at next scheduled taxane infusion to apply study device. [name] has been provided direct contact information and is encouraged to contact this Nurse for any needs or question.   Christy Larsen, RN, BSN Clinical Research Nurse [phone] 05/23/2024

## 2024-05-23 NOTE — Assessment & Plan Note (Signed)
 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%    CT CAP 04/12/2024: Right breast mass and prominent right axillary lymph nodes, calcified omental nodules (probably granulomatous disease) peritoneal carcinomatosis not excluded.,  Left ovary lesion 3.8 cm Bone scan 04/13/2024: 2 foci of increased uptake in the cervical spine region indeterminate recommend MRI spine Breast MRI 04/13/2024: Right breast non-mass enhancement 9 cm, 3 abnormal right axillary lymph nodes   Treatment plan: Neoadjuvant chemotherapy with Taxol  carbo Keytruda  weekly x 12 followed by Adriamycin Cytoxan Keytruda  followed by Keytruda  maintenance Breast conserving surgery with ALND Adjuvant radiation therapy   GYN evaluation for the ovarian adnexal lesion: Status post pelvic ultrasound.  Dr. Comer Dollar intends to do surgery in conjunction with breast surgery -------------------------------------------------------------------------------------------------------------------- Current treatment: Cycle 6 Taxol  carbo Keytruda  (started 04/19/2024) participation in S2205 clinical trial  Chemo toxicities: Fatigue Slight leukopenia: I reduced the dosage of Taxol  Nailbed discoloration   Otherwise tolerated chemo fairly well. Return to clinic weekly for chemo and every other week for follow-up with me

## 2024-05-23 NOTE — Progress Notes (Signed)
 Patient Care Team: [name], MD as PCP - General (Family Medicine) Gerome, Devere HERO, RN as Oncology Nurse Navigator Tyree Nanetta SAILOR, RN as Oncology Nurse Navigator Odean Potts, MD as Consulting Physician (Hematology and Oncology) Dewey Rush, MD as Consulting Physician (Radiation Oncology) Curvin Deward MOULD, MD as Consulting Physician (General Surgery)  DIAGNOSIS:  Encounter Diagnosis  Name Primary?   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  The report date is April 19, 2024.  The CancerNext-Expanded gene panel offered by [facility] and includes sequencing, rearrangement, and RNA analysis for the following 77 genes: AIP, ALK, APC, ATM, BAP1, BARD1, BMPR1A, BRCA1, BRCA2, BRIP1, CDC73, CDH1, CDK4, CDKN1B, CDKN2A, CEBPA, CHEK2, CTNNA1, DDX41, DICER1, ETV6, FH, FLCN, GATA2, LZTR1, MAX, MBD4, MEN1, MET, MLH1, MSH2, MSH3, MSH6, MUTYH, NF1, NF2, NTHL1, PALB2, PHOX2B, PMS2, POT1, PRKAR1A, PTCH1, PTEN, RAD51C, RAD51D, RB1,  RET, RPS20, RUNX1, SDHA, SDHAF2, SDHB, SDHC, SDHD, SMAD4, SMARCA4, SMARCB1, SMARCE1, STK11, SUFU, TMEM127, TP53, TSC1, TSC2, VHL, and WT1 (sequencing and deletion/duplication); AXIN2, CTNNA1, DDX41, EGFR, HOXB13, KIT, MBD4, MITF, MSH3, PDGFRA, POLD1 and POLE (sequencing only); EPCAM and GREM1 (deletion/duplication only). RNA data is routinely analyzed for use in variant interpretation for all genes.      CHIEF COMPLIANT: Cycle 6 Taxol  carbo Keytruda   HISTORY OF PRESENT ILLNESS:  History of Present Illness Christy Wiley is a 73 year old female with aggressive breast cancer who presents for chemotherapy treatment.  She is undergoing her sixth chemotherapy session out of twelve planned weekly treatments. Her white blood cell count is 4.8, which is within acceptable limits for continuing chemotherapy. Her hemoglobin level is 10.6, indicating mild anemia, which fluctuates due to chemotherapy.  She uses a cream on her port site weekly and has an adequate supply. She has noticed changes in her nail beds. Occasionally, she experiences a strong sensation in her shoulder without associated pain. There is no constant tingling or numbness in her fingers and no pain in her shoulder.     ALLERGIES:  is allergic to penicillin g.  MEDICATIONS:  Current Outpatient Medications  Medication Sig Dispense Refill   atorvastatin (LIPITOR) 40 MG tablet Take 40 mg by mouth.     lidocaine -prilocaine  (EMLA ) cream Apply to affected area once 30 g 3   lisinopril (ZESTRIL) 5 MG tablet Take 5 mg by mouth.     meloxicam (MOBIC) 7.5 MG tablet Take 7.5 mg by mouth daily as needed.     ondansetron  (ZOFRAN ) 8 MG tablet Take 1 tablet (8 mg total) by mouth every  8 (eight) hours as needed for nausea or vomiting. Start on the third day after chemotherapy. 30 tablet 1   oxyCODONE  (ROXICODONE ) 5 MG immediate release tablet Take 1 tablet (5 mg total) by mouth every 6 (six) hours as needed. 10 tablet 0   pantoprazole  (PROTONIX ) 40  MG tablet Take 1 tablet (40 mg total) by mouth daily. 30 tablet 3   prochlorperazine  (COMPAZINE ) 10 MG tablet Take 1 tablet (10 mg total) by mouth every 6 (six) hours as needed for nausea or vomiting. 30 tablet 1   Tirzepatide (MOUNJARO [place]) Inject into the skin.     No current facility-administered medications for this visit.    PHYSICAL EXAMINATION: ECOG PERFORMANCE STATUS: 1 - Symptomatic but completely ambulatory  Vitals:   05/23/24 1257  BP: 133/78  Pulse: 80  Resp: 17  Temp: (!) 97.3 F (36.3 C)  SpO2: 100%   Filed Weights   05/23/24 1257  Weight: 142 lb 11.2 oz (64.7 kg)    LABORATORY DATA:  I have reviewed the data as listed    Latest Ref Rng & Units 05/23/2024   12:27 PM 05/16/2024   12:58 PM 05/09/2024   10:55 AM  CMP  Glucose 70 - 99 mg/dL 861  847  843   BUN 8 - 23 mg/dL 15  18  11    Creatinine 0.44 - 1.00 mg/dL 9.35  9.25  9.36   Sodium 135 - 145 mmol/L 140  138  140   Potassium 3.5 - 5.1 mmol/L 3.9  4.0  3.6   Chloride 98 - 111 mmol/L 111  110  110   CO2 22 - 32 mmol/L 25  24  26    Calcium 8.9 - 10.3 mg/dL 9.2  8.9  8.8   Total Protein 6.5 - 8.1 g/dL 6.5  6.7  6.4   Total Bilirubin 0.0 - 1.2 mg/dL 0.4  0.4  0.3   Alkaline Phos 38 - 126 U/L 49  50  64   AST 15 - 41 U/L 11  14  12    ALT 0 - 44 U/L 11  18  9      Lab Results  Component Value Date   WBC 4.8 05/23/2024   HGB 10.6 (L) 05/23/2024   HCT 31.7 (L) 05/23/2024   MCV 88.3 05/23/2024   PLT 300 05/23/2024   NEUTROABS 3.0 05/23/2024    ASSESSMENT & PLAN:  Malignant neoplasm of overlapping sites of right breast in female, estrogen receptor negative (HCC) 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%    CT CAP 04/12/2024: Right breast mass and prominent right axillary lymph nodes, calcified omental nodules (probably granulomatous disease) peritoneal carcinomatosis  not excluded.,  Left ovary lesion 3.8 cm Bone scan 04/13/2024: 2 foci of increased uptake in the cervical spine region indeterminate recommend MRI spine Breast MRI 04/13/2024: Right breast non-mass enhancement 9 cm, 3 abnormal right axillary lymph nodes   Treatment plan: Neoadjuvant chemotherapy with Taxol  carbo Keytruda  weekly x 12 followed by Adriamycin Cytoxan Keytruda  followed by Keytruda  maintenance Breast conserving surgery with ALND Adjuvant radiation therapy   GYN evaluation for the ovarian adnexal lesion: Status post pelvic ultrasound.  Dr. Comer Dollar intends to do surgery in conjunction with breast surgery -------------------------------------------------------------------------------------------------------------------- Current treatment: Cycle 6 Taxol  carbo Keytruda  (started 04/19/2024) participation in S2205 clinical trial  Chemo toxicities: Fatigue Slight leukopenia: I reduced the  dosage of Taxol  Nailbed discoloration Chemotherapy-induced anemia   Otherwise tolerated chemo fairly well. Return to clinic weekly for chemo and every other week for follow-up with me   No orders of the defined types were placed in this encounter.  The patient has a good understanding of the overall plan. she agrees with it. she will call with any problems that may develop before the next visit here.  I personally spent a total of 30 minutes in the care of the patient today including preparing to see the patient, getting/reviewing separately obtained history, performing a medically appropriate exam/evaluation, counseling and educating, placing orders, referring and communicating with other health care professionals, documenting clinical information in the EHR, independently interpreting results, communicating results, and coordinating care.   Viinay K Raeanne Deschler, MD 05/23/24

## 2024-05-24 ENCOUNTER — Encounter: Payer: Self-pay | Admitting: General Practice

## 2024-05-24 ENCOUNTER — Encounter: Payer: Self-pay | Admitting: Hematology and Oncology

## 2024-05-24 ENCOUNTER — Ambulatory Visit: Payer: Self-pay | Admitting: Gynecologic Oncology

## 2024-05-24 ENCOUNTER — Inpatient Hospital Stay

## 2024-05-24 VITALS — BP 132/76 | HR 83 | Temp 98.2°F | Resp 16

## 2024-05-24 DIAGNOSIS — Z171 Estrogen receptor negative status [ER-]: Secondary | ICD-10-CM

## 2024-05-24 DIAGNOSIS — Z5111 Encounter for antineoplastic chemotherapy: Secondary | ICD-10-CM | POA: Diagnosis not present

## 2024-05-24 LAB — CA 125: Cancer Antigen (CA) 125: 12.2 U/mL (ref 0.0–38.1)

## 2024-05-24 MED ORDER — FILGRASTIM-SNDZ 300 MCG/0.5ML IJ SOSY
300.0000 ug | PREFILLED_SYRINGE | Freq: Once | INTRAMUSCULAR | Status: AC
  Administered 2024-05-24: 300 ug via SUBCUTANEOUS
  Filled 2024-05-24: qty 0.5

## 2024-05-24 NOTE — Progress Notes (Signed)
 [facility] Spiritual Care Note  Followed up with Ms Blondie by phone. 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  She was upbeat again on this encounter, reflecting that God and a positive attitude carry her through whatever needs her attention. Affirmed that practicing a positive, upbeat, grateful outlook is one of her spiritual gifts.  Ms Eimer welcomes continued Spiritual Care check-ins for support and encouragement, and also knows to contact chaplain whenever needed/desired.  [address] [place], [place], [facility] Pager [phone] Voicemail [phone]

## 2024-05-25 ENCOUNTER — Inpatient Hospital Stay

## 2024-05-25 ENCOUNTER — Telehealth: Payer: Self-pay | Admitting: *Deleted

## 2024-05-25 VITALS — BP 136/65 | HR 86 | Temp 98.6°F | Resp 16

## 2024-05-25 DIAGNOSIS — C50811 Malignant neoplasm of overlapping sites of right female breast: Secondary | ICD-10-CM

## 2024-05-25 DIAGNOSIS — Z5111 Encounter for antineoplastic chemotherapy: Secondary | ICD-10-CM | POA: Diagnosis not present

## 2024-05-25 MED ORDER — FILGRASTIM-SNDZ 300 MCG/0.5ML IJ SOSY
300.0000 ug | PREFILLED_SYRINGE | Freq: Once | INTRAMUSCULAR | Status: AC
  Administered 2024-05-25: 300 ug via SUBCUTANEOUS
  Filled 2024-05-25: qty 0.5

## 2024-05-25 NOTE — Telephone Encounter (Signed)
 Received call from pt requesting if okay to proceed with dental cleaning while undergoing tx.  Per MD, pt needing to hold off on routine cleaning until therapy is completed.  Pt educated and verbalized understanding.

## 2024-05-26 ENCOUNTER — Inpatient Hospital Stay

## 2024-05-26 ENCOUNTER — Ambulatory Visit (HOSPITAL_COMMUNITY): Admission: RE | Admit: 2024-05-26

## 2024-05-26 VITALS — BP 140/68 | HR 97 | Temp 98.5°F | Resp 16

## 2024-05-26 DIAGNOSIS — Z5111 Encounter for antineoplastic chemotherapy: Secondary | ICD-10-CM | POA: Diagnosis not present

## 2024-05-26 DIAGNOSIS — C50811 Malignant neoplasm of overlapping sites of right female breast: Secondary | ICD-10-CM

## 2024-05-26 MED ORDER — FILGRASTIM-SNDZ 300 MCG/0.5ML IJ SOSY
300.0000 ug | PREFILLED_SYRINGE | Freq: Once | INTRAMUSCULAR | Status: AC
  Administered 2024-05-26: 300 ug via SUBCUTANEOUS
  Filled 2024-05-26: qty 0.5

## 2024-05-29 ENCOUNTER — Telehealth: Payer: Self-pay | Admitting: *Deleted

## 2024-05-29 NOTE — Telephone Encounter (Signed)
 Spoke with Christy Wiley who called to state she received a call that she missed an appointment?  Advised patient she missed her scheduled MRI on Friday, October 24th. Patient states she was unaware that this was scheduled and doesn't want to have an MRI at this time. Patient states she will discuss this with Dr. Viktoria after her treatments. Advised patient that her message will be relayed to provider.

## 2024-05-29 NOTE — Telephone Encounter (Signed)
 [name], ICE COMPRESS: RANDOMIZED TRIAL OF LIMB CRYOCOMPRESSION VERSUS CONTINUOUS COMPRESSION VERSUS LOW CYCLIC COMPRESSION FOR THE PREVENTION  OF TAXANE-INDUCED PERIPHERAL NEUROPATHY   Spoke with pt this afternoon to remind her of her appointment with clinical research tomorrow 05/30/24 at 11am. Pt stated that she plans on coming and will see us  tomorrow.   [name], RN, BSN Clinical Research Nurse [phone] 05/29/2024

## 2024-05-29 NOTE — Telephone Encounter (Signed)
 Ok - Christy Wiley, could you please help ensure she is scheduled for a phone visit with me after she finishes treatment? Thanks!

## 2024-05-30 ENCOUNTER — Encounter: Payer: Self-pay | Admitting: Hematology and Oncology

## 2024-05-30 ENCOUNTER — Encounter: Payer: Self-pay | Admitting: Adult Health

## 2024-05-30 ENCOUNTER — Encounter: Payer: Self-pay | Admitting: *Deleted

## 2024-05-30 ENCOUNTER — Inpatient Hospital Stay

## 2024-05-30 ENCOUNTER — Inpatient Hospital Stay (HOSPITAL_BASED_OUTPATIENT_CLINIC_OR_DEPARTMENT_OTHER): Admitting: Adult Health

## 2024-05-30 VITALS — BP 148/78 | HR 89 | Temp 97.8°F | Resp 16 | Ht 63.0 in | Wt 141.3 lb

## 2024-05-30 DIAGNOSIS — Z5111 Encounter for antineoplastic chemotherapy: Secondary | ICD-10-CM | POA: Diagnosis not present

## 2024-05-30 DIAGNOSIS — C50811 Malignant neoplasm of overlapping sites of right female breast: Secondary | ICD-10-CM

## 2024-05-30 DIAGNOSIS — Z171 Estrogen receptor negative status [ER-]: Secondary | ICD-10-CM

## 2024-05-30 LAB — CMP (CANCER CENTER ONLY)
ALT: 15 U/L (ref 0–44)
AST: 14 U/L — ABNORMAL LOW (ref 15–41)
Albumin: 4 g/dL (ref 3.5–5.0)
Alkaline Phosphatase: 70 U/L (ref 38–126)
Anion gap: 4 — ABNORMAL LOW (ref 5–15)
BUN: 14 mg/dL (ref 8–23)
CO2: 26 mmol/L (ref 22–32)
Calcium: 8.9 mg/dL (ref 8.9–10.3)
Chloride: 109 mmol/L (ref 98–111)
Creatinine: 0.71 mg/dL (ref 0.44–1.00)
GFR, Estimated: >60 mL/min (ref >60)
Glucose, Bld: 168 mg/dL — ABNORMAL HIGH (ref 70–99)
Potassium: 4.2 mmol/L (ref 3.5–5.1)
Sodium: 139 mmol/L (ref 135–145)
Total Bilirubin: 0.3 mg/dL (ref 0.0–1.2)
Total Protein: 6.8 g/dL (ref 6.5–8.1)

## 2024-05-30 LAB — CBC WITH DIFFERENTIAL (CANCER CENTER ONLY)
Abs Immature Granulocytes: 0.44 K/uL — ABNORMAL HIGH (ref 0.00–0.07)
Basophils Absolute: 0.1 K/uL (ref 0.0–0.1)
Basophils Relative: 1 %
Eosinophils Absolute: 0.1 K/uL (ref 0.0–0.5)
Eosinophils Relative: 1 %
HCT: 35 % — ABNORMAL LOW (ref 36.0–46.0)
Hemoglobin: 11.5 g/dL — ABNORMAL LOW (ref 12.0–15.0)
Immature Granulocytes: 6 %
Lymphocytes Relative: 25 %
Lymphs Abs: 2 K/uL (ref 0.7–4.0)
MCH: 29.5 pg (ref 26.0–34.0)
MCHC: 32.9 g/dL (ref 30.0–36.0)
MCV: 89.7 fL (ref 80.0–100.0)
Monocytes Absolute: 0.9 K/uL (ref 0.1–1.0)
Monocytes Relative: 12 %
Neutro Abs: 4.4 K/uL (ref 1.7–7.7)
Neutrophils Relative %: 55 %
Platelet Count: 247 K/uL (ref 150–400)
RBC: 3.9 MIL/uL (ref 3.87–5.11)
RDW: 15.5 % (ref 11.5–15.5)
WBC Count: 7.8 K/uL (ref 4.0–10.5)
nRBC: 0.4 % — ABNORMAL HIGH (ref 0.0–0.2)

## 2024-05-30 LAB — TSH: TSH: 0.589 u[IU]/mL (ref 0.350–4.500)

## 2024-05-30 LAB — RESEARCH LABS

## 2024-05-30 MED ORDER — DIPHENHYDRAMINE HCL 50 MG/ML IJ SOLN
50.0000 mg | Freq: Once | INTRAMUSCULAR | Status: AC
  Administered 2024-05-30: 50 mg via INTRAVENOUS
  Filled 2024-05-30: qty 1

## 2024-05-30 MED ORDER — SODIUM CHLORIDE 0.9 % IV SOLN
200.0000 mg | Freq: Once | INTRAVENOUS | Status: AC
  Administered 2024-05-30: 200 mg via INTRAVENOUS
  Filled 2024-05-30: qty 200

## 2024-05-30 MED ORDER — SODIUM CHLORIDE 0.9 % IV SOLN: INTRAVENOUS | Status: DC

## 2024-05-30 MED ORDER — DEXAMETHASONE SOD PHOSPHATE PF 10 MG/ML IJ SOLN
10.0000 mg | Freq: Once | INTRAMUSCULAR | Status: AC
  Administered 2024-05-30: 10 mg via INTRAVENOUS

## 2024-05-30 MED ORDER — SODIUM CHLORIDE 0.9 % IV SOLN
65.0000 mg/m2 | Freq: Once | INTRAVENOUS | Status: AC
  Administered 2024-05-30: 114 mg via INTRAVENOUS
  Filled 2024-05-30: qty 19

## 2024-05-30 MED ORDER — SODIUM CHLORIDE 0.9 % IV SOLN
117.9000 mg | Freq: Once | INTRAVENOUS | Status: AC
  Administered 2024-05-30: 120 mg via INTRAVENOUS
  Filled 2024-05-30: qty 12

## 2024-05-30 MED ORDER — PALONOSETRON HCL INJECTION 0.25 MG/5ML
0.2500 mg | Freq: Once | INTRAVENOUS | Status: AC
  Administered 2024-05-30: 0.25 mg via INTRAVENOUS
  Filled 2024-05-30: qty 5

## 2024-05-30 MED ORDER — FAMOTIDINE IN NACL 20-0.9 MG/50ML-% IV SOLN
20.0000 mg | Freq: Once | INTRAVENOUS | Status: AC
  Administered 2024-05-30: 20 mg via INTRAVENOUS
  Filled 2024-05-30: qty 50

## 2024-05-30 NOTE — Progress Notes (Signed)
 Christy Wiley:    Christy Francisco, MD [address] [place] [address]   DIAGNOSIS: Cancer Staging  Malignant neoplasm of overlapping sites of right breast in female, estrogen receptor negative (HCC) Staging form: Breast, AJCC 8th Edition - Clinical: Stage IIB (cT1c, cN1, cM0, G3, ER-, PR-, HER2-) - Signed by Odean Potts, MD on 04/05/2024 Stage prefix: Initial diagnosis Histologic grading system: 3 grade system    SUMMARY OF ONCOLOGIC HISTORY: Oncology History  Malignant neoplasm of overlapping sites of right breast in female, estrogen receptor negative (HCC)  03/28/2024 Initial Diagnosis   Screening mammogram detected right breast asymmetry, 2 lesions 1.9 cm at 10:00 and 0.7 cm at the 11:00: Biopsy: 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  The report date is April 19, 2024.  The CancerNext-Expanded gene panel offered by [facility] and includes sequencing, rearrangement, and RNA analysis for the following 77 genes: AIP, ALK, APC, ATM, BAP1, BARD1, BMPR1A, BRCA1, BRCA2, BRIP1, CDC73, CDH1, CDK4, CDKN1B, CDKN2A, CEBPA, CHEK2, CTNNA1, DDX41, DICER1, ETV6, FH, FLCN, GATA2, LZTR1, MAX, MBD4, MEN1, MET, MLH1, MSH2, MSH3, MSH6, MUTYH, NF1, NF2, NTHL1, PALB2, PHOX2B, PMS2, POT1, PRKAR1A, PTCH1, PTEN, RAD51C, RAD51D, RB1, RET, RPS20, RUNX1, SDHA, SDHAF2, SDHB, SDHC, SDHD, SMAD4, SMARCA4,  SMARCB1, SMARCE1, STK11, SUFU, TMEM127, TP53, TSC1, TSC2, VHL, and WT1 (sequencing and deletion/duplication); AXIN2, CTNNA1, DDX41, EGFR, HOXB13, KIT, MBD4, MITF, MSH3, PDGFRA, POLD1 and POLE (sequencing only); EPCAM and GREM1 (deletion/duplication only). RNA data is routinely analyzed for use in variant interpretation for all genes.      CURRENT THERAPY:  INTERVAL HISTORY:  Discussed the use of AI scribe software for clinical note transcription with the patient, who gave verbal consent to proceed.  Christy Wiley 73 y.o. female returns for    Patient Active Problem List   Diagnosis Date Noted   Genetic testing 04/24/2024   Family history of prostate cancer    Family history of ovarian cancer    Malignant neoplasm of overlapping sites of right breast in female, estrogen receptor negative (HCC) 04/04/2024    is allergic to penicillin g.  MEDICAL HISTORY: Past Medical History:  Diagnosis Date   Anemia    Arthritis    Blood transfusion without reported diagnosis    Cancer (HCC)    Diabetes (HCC)    Family history of ovarian cancer    Family history of prostate cancer    Fibroid    Vaginal Pap smear, abnormal     SURGICAL HISTORY: Past Surgical History:  Procedure Laterality Date   ABDOMINAL HYSTERECTOMY     fibroids, left adnexa   BREAST BIOPSY Right 03/28/2024   US  RT BREAST BX W LOC DEV 1ST LESION IMG BX SPEC US  GUIDE 03/28/2024 [facility]   BREAST BIOPSY Right 03/28/2024   US  RT BREAST BX W LOC DEV EA ADD LESION IMG BX SPEC US  GUIDE 03/28/2024 [facility]  PORTACATH PLACEMENT Left 04/14/2024   Procedure: INSERTION, TUNNELED CENTRAL VENOUS DEVICE, WITH PORT;  Surgeon: Curvin Deward MOULD, MD;  Location:  SURGERY CENTER;  Service: General;  Laterality: Left;  PORT PLACEMENT WITH ULTRASOUND GUIDANCE    SOCIAL HISTORY: Social History   Socioeconomic History   Marital status: Single    Spouse name: Not on file   Number of children: 0   Years of  education: Not on file   Highest education level: Not on file  Occupational History   Not on file  Tobacco Use   Smoking status: Former    Current packs/day: 0.00    Types: Cigarettes    Quit date: 01/1996    Years since quitting: 28.4   Smokeless tobacco: Never  Vaping Use   Vaping status: Never Used  Substance and Sexual Activity   Alcohol  use: Never   Drug use: Never   Sexual activity: Not Currently  Other Topics Concern   Not on file  Social History Narrative   Not on file   Social Drivers of Health   Financial Resource Strain: Not on file  Food Insecurity: No Food Insecurity (04/05/2024)   Hunger Vital Sign    Worried About Running Out of Food in the Last Year: Never true    Ran Out of Food in the Last Year: Never true  Transportation Needs: No Transportation Needs (04/05/2024)   PRAPARE - Administrator, Civil Service (Medical): No    Lack of Transportation (Non-Medical): No  Physical Activity: Not on file  Stress: Not on file  Social Connections: Not on file  Intimate Partner Violence: Not At Risk (04/05/2024)   Humiliation, Afraid, Rape, and Kick questionnaire    Fear of Current or Ex-Partner: No    Emotionally Abused: No    Physically Abused: No    Sexually Abused: No    FAMILY HISTORY: Family History  Problem Relation Age of Onset   Cancer Brother    Prostate cancer Brother    Ovarian cancer Maternal Aunt    Cervical cancer Maternal Aunt    Breast cancer Neg Hx     Review of Systems - Oncology    PHYSICAL EXAMINATION    Vitals:   05/30/24 1202  BP: (!) 148/78  Pulse: 89  Resp: 16  Temp: 97.8 F (36.6 C)  SpO2: 100%    Physical Exam  LABORATORY DATA:  CBC    Component Value Date/Time   WBC 7.8 05/30/2024 1140   RBC 3.90 05/30/2024 1140   HGB 11.5 (L) 05/30/2024 1140   HCT 35.0 (L) 05/30/2024 1140   PLT 247 05/30/2024 1140   MCV 89.7 05/30/2024 1140   MCH 29.5 05/30/2024 1140   MCHC 32.9 05/30/2024 1140   RDW 15.5  05/30/2024 1140   LYMPHSABS 2.0 05/30/2024 1140   MONOABS 0.9 05/30/2024 1140   EOSABS 0.1 05/30/2024 1140   BASOSABS 0.1 05/30/2024 1140    CMP     Component Value Date/Time   NA 140 05/23/2024 1227   K 3.9 05/23/2024 1227   CL 111 05/23/2024 1227   CO2 25 05/23/2024 1227   GLUCOSE 138 (H) 05/23/2024 1227   BUN 15 05/23/2024 1227   CREATININE 0.64 05/23/2024 1227   CALCIUM 9.2 05/23/2024 1227   PROT 6.5 05/23/2024 1227   ALBUMIN 3.9 05/23/2024 1227   AST 11 (L) 05/23/2024 1227   ALT 11 05/23/2024 1227   ALKPHOS 49 05/23/2024 1227   BILITOT 0.4 05/23/2024 1227  GFRNONAA >60 05/23/2024 1227     ASSESSMENT and THERAPY PLAN:   No problem-specific Assessment & Plan notes found for this encounter.     All questions were answered. The patient knows to call the clinic with any problems, questions or concerns. We can certainly see the patient much sooner if necessary.  Total encounter time:*** minutes*in face-to-face visit time, chart review, lab review, care coordination, order entry, and documentation of the encounter time.    [name], NP 05/30/24 12:23 PM Medical Oncology and Hematology [facility] [address] [place], [place] [address] Tel. [phone]    Fax. [phone]  *Total Encounter Time as defined by the Centers for Medicare and Medicaid Services includes, in addition to the face-to-face time of a patient visit (documented in the note above) non-face-to-face time: obtaining and reviewing outside history, ordering and reviewing medications, tests or procedures, care coordination (communications with other health care professionals or caregivers) and documentation in the medical record.

## 2024-05-30 NOTE — Patient Instructions (Signed)
 [facility] - A DEPT OF [facility]  Discharge Instructions: Thank you for choosing [facility] to provide your oncology and hematology care.   If you have a lab appointment with the Cancer Center, please go directly to the Cancer Center and check in at the registration area.   Wear comfortable clothing and clothing appropriate for easy access to any Portacath or PICC line.   We strive to give you quality time with your provider. You may need to reschedule your appointment if you arrive late (15 or more minutes).  Arriving late affects you and other patients whose appointments are after yours.  Also, if you miss three or more appointments without notifying the office, you may be dismissed from the clinic at the provider's discretion.      For prescription refill requests, have your pharmacy contact our office and allow 72 hours for refills to be completed.    Today you received the following chemotherapy and/or immunotherapy agents: Pembrolizumab  (Keytruda ), Paclitaxel  (Taxol ), & Carboplatin  (Paraplatin )   To help prevent nausea and vomiting after your treatment, we encourage you to take your nausea medication as directed.  BELOW ARE SYMPTOMS THAT SHOULD BE REPORTED IMMEDIATELY: *FEVER GREATER THAN 100.4 F (38 C) OR HIGHER *CHILLS OR SWEATING *NAUSEA AND VOMITING THAT IS NOT CONTROLLED WITH YOUR NAUSEA MEDICATION *UNUSUAL SHORTNESS OF BREATH *UNUSUAL BRUISING OR BLEEDING *URINARY PROBLEMS (pain or burning when urinating, or frequent urination) *BOWEL PROBLEMS (unusual diarrhea, constipation, pain near the anus) TENDERNESS IN MOUTH AND THROAT WITH OR WITHOUT PRESENCE OF ULCERS (sore throat, sores in mouth, or a toothache) UNUSUAL RASH, SWELLING OR PAIN  UNUSUAL VAGINAL DISCHARGE OR ITCHING   Items with * indicate a potential emergency and should be followed up as soon as possible or go to the Emergency Department if any problems should  occur.  Please show the CHEMOTHERAPY ALERT CARD or IMMUNOTHERAPY ALERT CARD at check-in to the Emergency Department and triage nurse.  Should you have questions after your visit or need to cancel or reschedule your appointment, please contact [facility] - A DEPT OF [facility]  Dept: [phone]  and follow the prompts.  Office hours are 8:00 a.m. to 4:30 p.m. Monday - Friday. Please note that voicemails left after 4:00 p.m. may not be returned until the following business day.  We are closed weekends and major holidays. You have access to a nurse at all times for urgent questions. Please call the main number to the clinic Dept: [phone] and follow the prompts.   For any non-urgent questions, you may also contact your provider using MyChart. We now offer e-Visits for anyone 51 and older to request care online for non-urgent symptoms. For details visit [email or web address].   Also download the MyChart app! Go to the app store, search MyChart, open the app, select [place], and log in with your MyChart username and password.

## 2024-05-30 NOTE — Research (Unsigned)
 [name], ICE COMPRESS: RANDOMIZED TRIAL OF LIMB CRYOCOMPRESSION VERSUS CONTINUOUS COMPRESSION VERSUS LOW CYCLIC COMPRESSION FOR THE PREVENTION OF TAXANE-INDUCED PERIPHERAL NEUROPATHY     Week 7 Patient arrives today unaccompanied for the week 7 treatment. Confirmed patient does not have wounds, sores, or lesions to extremities. Pt completed her PROs, neuropen testing, tuning fork testing, timed getup and go test, and device tolerability assessment. Laury Quale Clinical research coordinator assisted with timing of tests.    LABS: Optional labs are collected via port per consent and study protocol: [name] tolerated well without complaint.   MD/PROVIDER VISIT: Patient sees [name] for today's visit.    ADVERSE EVENTS: Solicited AEs reviewed with patient. Patient reports numbness in tips of all her fingers on both hands. Doesn't affect any activities or her use of her hands. Pt states it's worse in the morning and gradually gets better in the later part of the day. Pt states she tries to massage her hands through out the day which helps relieve the numbness. Dr. Odean states symptoms are definitely related to Taxane and not study device related. Grade 1 peripheral sensory neuropathy.  She denies any other symptoms. See AE table below.   Adverse Event CTCAE Grade Onset date Resolved date Relationship to Study Intervention Action Taken Comments  Skin atrophy (solicited) 0            Skin hyperpigmentation (solicited) 0            Skin hypopigmentation (solicited) 0            Skin induration (solicited) 0            Skin ulceration (solicited) 0            Rash maculopapular (solicited) 0            Nail changes (solicited) 0            Cold intolerance (solicited) (general disorders and administration site conditions- other) 0            Frostbite (solicited) (skin and subcutaneous tissue disorders- other) 0            Nail discoloration (Non solicited)  1 05/09/24 Ongoing    None    Peripheral sensory neuropath (Non Solicited) 1 05/30/24 Ongoing unrelated None       STUDY INTERVENTION & TOLERABILITY ASSESSMENTS: 2:10 pm am Device wraps applied; pre-treatment started set on Arm 1 CyroCompression with temperature setting at 11 degree celsius and cyclic pressure 5-15 mm Hg.  2:20 pm Required 5-15 minute Tolerability check- pt states tolerable to cyrocompression with temperature setting at 11 degree celsius and cyclic pressure 5-15 mm Hg. 7:44 pm Taxane infusion started and moved to treatment phase on Paxman machine.  Cyrocompression with temperature setting at 11 degree celsius and cyclic pressure 5-15 mm Hg. 6:61 pm Bathroom break. Wraps removed  3:45 pm Wraps replaced and intervention restarted.   3:57 pmTaxane infusion end time. Moved to post-treatment phase.  4:34 pm Wraps removed. Intervention ended. Pt made up bathroom break (7 minutes total) during post treatment phase per protocol. Confirmed no new wounds, sores, or lesions.      The patient was thanked for their time and continued voluntary participation in this study. She understands research nurse will meet her again at next scheduled taxane infusion to apply study device. [name] has been provided direct contact information and is encouraged to contact this Nurse for any needs or question.   Mallery Harshman,  RN, BSN Clinical Research Nurse [phone] 05/30/2024

## 2024-05-30 NOTE — Telephone Encounter (Signed)
 Called Christy Wiley and she asked to schedule a telephone visit with Dr. Viktoria next week to discuss the MRI.  Appointment scheduled on 06/08/24 at 6:00 pm.

## 2024-05-31 ENCOUNTER — Other Ambulatory Visit: Payer: Self-pay | Admitting: Adult Health

## 2024-05-31 DIAGNOSIS — C50811 Malignant neoplasm of overlapping sites of right female breast: Secondary | ICD-10-CM

## 2024-05-31 LAB — T4: T4, Total: 6.8 ug/dL (ref 4.5–12.0)

## 2024-06-01 ENCOUNTER — Encounter: Payer: Self-pay | Admitting: Hematology and Oncology

## 2024-06-01 NOTE — Assessment & Plan Note (Signed)
 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%    CT CAP 04/12/2024: Right breast mass and prominent right axillary lymph nodes, calcified omental nodules (probably granulomatous disease) peritoneal carcinomatosis not excluded.,  Left ovary lesion 3.8 cm Bone scan 04/13/2024: 2 foci of increased uptake in the cervical spine region indeterminate recommend MRI spine Breast MRI 04/13/2024: Right breast non-mass enhancement 9 cm, 3 abnormal right axillary lymph nodes   Treatment plan: Neoadjuvant chemotherapy with Taxol  carbo Keytruda  weekly x 12 followed by Adriamycin Cytoxan Keytruda  followed by Keytruda  maintenance Breast conserving surgery with ALND Adjuvant radiation therapy   GYN evaluation for the ovarian adnexal lesion: Status post pelvic ultrasound.  Dr. Comer Dollar intends to do surgery in conjunction with breast surgery -------------------------------------------------------------------------------------------------------------------- Current treatment:  Taxol  carbo Keytruda  (started 04/19/2024) participation in S2205 clinical trial

## 2024-06-06 ENCOUNTER — Inpatient Hospital Stay (HOSPITAL_BASED_OUTPATIENT_CLINIC_OR_DEPARTMENT_OTHER): Admitting: Adult Health

## 2024-06-06 ENCOUNTER — Encounter: Payer: Self-pay | Admitting: Adult Health

## 2024-06-06 ENCOUNTER — Inpatient Hospital Stay

## 2024-06-06 ENCOUNTER — Encounter: Payer: Self-pay | Admitting: *Deleted

## 2024-06-06 ENCOUNTER — Encounter: Payer: Self-pay | Admitting: Hematology and Oncology

## 2024-06-06 ENCOUNTER — Inpatient Hospital Stay: Attending: Hematology and Oncology

## 2024-06-06 VITALS — BP 130/71 | HR 87 | Temp 97.2°F | Resp 16 | Wt 143.3 lb

## 2024-06-06 DIAGNOSIS — C50811 Malignant neoplasm of overlapping sites of right female breast: Secondary | ICD-10-CM

## 2024-06-06 DIAGNOSIS — D0511 Intraductal carcinoma in situ of right breast: Secondary | ICD-10-CM | POA: Diagnosis present

## 2024-06-06 DIAGNOSIS — Z171 Estrogen receptor negative status [ER-]: Secondary | ICD-10-CM

## 2024-06-06 DIAGNOSIS — Z5111 Encounter for antineoplastic chemotherapy: Secondary | ICD-10-CM | POA: Insufficient documentation

## 2024-06-06 DIAGNOSIS — Z79899 Other long term (current) drug therapy: Secondary | ICD-10-CM | POA: Diagnosis not present

## 2024-06-06 LAB — CBC WITH DIFFERENTIAL (CANCER CENTER ONLY)
Abs Immature Granulocytes: 0.01 K/uL (ref 0.00–0.07)
Basophils Absolute: 0.1 K/uL (ref 0.0–0.1)
Basophils Relative: 1 %
Eosinophils Absolute: 0.1 K/uL (ref 0.0–0.5)
Eosinophils Relative: 1 %
HCT: 31.3 % — ABNORMAL LOW (ref 36.0–46.0)
Hemoglobin: 10.6 g/dL — ABNORMAL LOW (ref 12.0–15.0)
Immature Granulocytes: 0 %
Lymphocytes Relative: 32 %
Lymphs Abs: 1.6 K/uL (ref 0.7–4.0)
MCH: 30.4 pg (ref 26.0–34.0)
MCHC: 33.9 g/dL (ref 30.0–36.0)
MCV: 89.7 fL (ref 80.0–100.0)
Monocytes Absolute: 0.2 K/uL (ref 0.1–1.0)
Monocytes Relative: 5 %
Neutro Abs: 2.9 K/uL (ref 1.7–7.7)
Neutrophils Relative %: 61 %
Platelet Count: 247 K/uL (ref 150–400)
RBC: 3.49 MIL/uL — ABNORMAL LOW (ref 3.87–5.11)
RDW: 15.6 % — ABNORMAL HIGH (ref 11.5–15.5)
WBC Count: 4.9 K/uL (ref 4.0–10.5)
nRBC: 0 % (ref 0.0–0.2)

## 2024-06-06 LAB — CMP (CANCER CENTER ONLY)
ALT: 21 U/L (ref 0–44)
AST: 15 U/L (ref 15–41)
Albumin: 4.1 g/dL (ref 3.5–5.0)
Alkaline Phosphatase: 55 U/L (ref 38–126)
Anion gap: 6 (ref 5–15)
BUN: 21 mg/dL (ref 8–23)
CO2: 23 mmol/L (ref 22–32)
Calcium: 9 mg/dL (ref 8.9–10.3)
Chloride: 108 mmol/L (ref 98–111)
Creatinine: 0.68 mg/dL (ref 0.44–1.00)
GFR, Estimated: >60 mL/min (ref >60)
Glucose, Bld: 134 mg/dL — ABNORMAL HIGH (ref 70–99)
Potassium: 4.2 mmol/L (ref 3.5–5.1)
Sodium: 137 mmol/L (ref 135–145)
Total Bilirubin: 0.3 mg/dL (ref 0.0–1.2)
Total Protein: 6.7 g/dL (ref 6.5–8.1)

## 2024-06-06 MED ORDER — SODIUM CHLORIDE 0.9 % IV SOLN
117.9000 mg | Freq: Once | INTRAVENOUS | Status: AC
  Administered 2024-06-06: 120 mg via INTRAVENOUS
  Filled 2024-06-06: qty 12

## 2024-06-06 MED ORDER — DIPHENHYDRAMINE HCL 50 MG/ML IJ SOLN
50.0000 mg | Freq: Once | INTRAMUSCULAR | Status: AC
  Administered 2024-06-06: 50 mg via INTRAVENOUS
  Filled 2024-06-06: qty 1

## 2024-06-06 MED ORDER — SODIUM CHLORIDE 0.9 % IV SOLN: INTRAVENOUS | Status: DC

## 2024-06-06 MED ORDER — SODIUM CHLORIDE 0.9 % IV SOLN
65.0000 mg/m2 | Freq: Once | INTRAVENOUS | Status: AC
  Administered 2024-06-06: 114 mg via INTRAVENOUS
  Filled 2024-06-06: qty 19

## 2024-06-06 MED ORDER — FAMOTIDINE IN NACL 20-0.9 MG/50ML-% IV SOLN
20.0000 mg | Freq: Once | INTRAVENOUS | Status: AC
  Administered 2024-06-06: 20 mg via INTRAVENOUS
  Filled 2024-06-06: qty 50

## 2024-06-06 MED ORDER — PALONOSETRON HCL INJECTION 0.25 MG/5ML
0.2500 mg | Freq: Once | INTRAVENOUS | Status: AC
  Administered 2024-06-06: 0.25 mg via INTRAVENOUS
  Filled 2024-06-06: qty 5

## 2024-06-06 MED ORDER — DEXAMETHASONE SOD PHOSPHATE PF 10 MG/ML IJ SOLN
10.0000 mg | Freq: Once | INTRAMUSCULAR | Status: AC
  Administered 2024-06-06: 10 mg via INTRAVENOUS

## 2024-06-06 NOTE — Progress Notes (Signed)
 [facility] Cancer Follow up:    Christy Francisco, MD [address] [place] [address]   DIAGNOSIS:  Cancer Staging  Malignant neoplasm of overlapping sites of right breast in female, estrogen receptor negative (HCC) Staging form: Breast, AJCC 8th Edition - Clinical: Stage IIB (cT1c, cN1, cM0, G3, ER-, PR-, HER2-) - Signed by Odean Potts, MD on 04/05/2024 Stage prefix: Initial diagnosis Histologic grading system: 3 grade system    SUMMARY OF ONCOLOGIC HISTORY: Oncology History  Malignant neoplasm of overlapping sites of right breast in female, estrogen receptor negative (HCC)  03/28/2024 Initial Diagnosis   Screening mammogram detected right breast asymmetry, 2 lesions 1.9 cm at 10:00 and 0.7 cm at the 11:00: Biopsy: 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  The report date is April 19, 2024.  The CancerNext-Expanded gene panel offered by [facility] and includes sequencing, rearrangement, and RNA analysis for the following 77 genes: AIP, ALK, APC, ATM, BAP1, BARD1, BMPR1A, BRCA1, BRCA2, BRIP1, CDC73, CDH1, CDK4, CDKN1B, CDKN2A, CEBPA, CHEK2, CTNNA1, DDX41, DICER1, ETV6, FH, FLCN, GATA2, LZTR1, MAX, MBD4, MEN1, MET, MLH1, MSH2, MSH3, MSH6, MUTYH, NF1, NF2, NTHL1, PALB2, PHOX2B, PMS2, POT1, PRKAR1A, PTCH1, PTEN, RAD51C, RAD51D, RB1, RET, RPS20, RUNX1, SDHA, SDHAF2, SDHB, SDHC, SDHD, SMAD4, SMARCA4,  SMARCB1, SMARCE1, STK11, SUFU, TMEM127, TP53, TSC1, TSC2, VHL, and WT1 (sequencing and deletion/duplication); AXIN2, CTNNA1, DDX41, EGFR, HOXB13, KIT, MBD4, MITF, MSH3, PDGFRA, POLD1 and POLE (sequencing only); EPCAM and GREM1 (deletion/duplication only). RNA data is routinely analyzed for use in variant interpretation for all genes.      CURRENT THERAPY: Taxol , Carbo, Keytruda   INTERVAL HISTORY:  Discussed the use of AI scribe software for clinical note transcription with the patient, who gave verbal consent to proceed.  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  She is undergoing neoadjuvant chemotherapy with Taxol  and Carboplatin  and feels 'pretty good' overall. There is no numbness or tingling in her fingertips or toes, but her nails are 'still not looking good.'  She experiences a sensation of pulling when turning, related to the healing of her port. This sensation is noticeable but not painful.  Recent lab results show kidney and liver function within normal limits, a blood sugar level of 134, and slightly decreased hemoglobin. She is not experiencing significant fatigue.     Patient Active Problem List   Diagnosis Date Noted   Genetic testing 04/24/2024   Family history of prostate cancer    Family history of ovarian cancer    Malignant neoplasm of overlapping sites of right breast in female, estrogen receptor negative (HCC) 04/04/2024    is allergic to penicillin g.  MEDICAL HISTORY: Past Medical History:  Diagnosis Date   Anemia    Arthritis    Blood transfusion without reported diagnosis  Cancer (HCC)    Diabetes (HCC)    Family history of ovarian cancer    Family history of prostate cancer    Fibroid    Vaginal Pap smear, abnormal     SURGICAL HISTORY: Past Surgical History:  Procedure Laterality Date   ABDOMINAL HYSTERECTOMY      fibroids, left adnexa   BREAST BIOPSY Right 03/28/2024   US  RT BREAST BX W LOC DEV 1ST LESION IMG BX SPEC US  GUIDE 03/28/2024 [facility]   BREAST BIOPSY Right 03/28/2024   US  RT BREAST BX W LOC DEV EA ADD LESION IMG BX SPEC US  GUIDE 03/28/2024 [facility]   PORTACATH PLACEMENT Left 04/14/2024   Procedure: INSERTION, TUNNELED CENTRAL VENOUS DEVICE, WITH PORT;  Surgeon: Curvin Deward MOULD, MD;  Location: [facility];  Service: General;  Laterality: Left;  PORT PLACEMENT WITH ULTRASOUND GUIDANCE    SOCIAL HISTORY: Social History   Socioeconomic History   Marital status: Single    Spouse name: Not on file   Number of children: 0   Years of education: Not on file   Highest education level: Not on file  Occupational History   Not on file  Tobacco Use   Smoking status: Former    Current packs/day: 0.00    Types: Cigarettes    Quit date: 01/1996    Years since quitting: 28.4   Smokeless tobacco: Never  Vaping Use   Vaping status: Never Used  Substance and Sexual Activity   Alcohol  use: Never   Drug use: Never   Sexual activity: Not Currently  Other Topics Concern   Not on file  Social History Narrative   Not on file   Social Drivers of Health   Financial Resource Strain: Not on file  Food Insecurity: No Food Insecurity (04/05/2024)   Hunger Vital Sign    Worried About Running Out of Food in the Last Year: Never true    Ran Out of Food in the Last Year: Never true  Transportation Needs: No Transportation Needs (04/05/2024)   PRAPARE - Administrator, Civil Service (Medical): No    Lack of Transportation (Non-Medical): No  Physical Activity: Not on file  Stress: Not on file  Social Connections: Not on file  Intimate Partner Violence: Not At Risk (04/05/2024)   Humiliation, Afraid, Rape, and Kick questionnaire    Fear of Current or Ex-Partner: No    Emotionally Abused: No    Physically Abused: No    Sexually Abused: No    FAMILY  HISTORY: Family History  Problem Relation Age of Onset   Cancer Brother    Prostate cancer Brother    Ovarian cancer Maternal Aunt    Cervical cancer Maternal Aunt    Breast cancer Neg Hx     Review of Systems  Constitutional:  Positive for fatigue. Negative for appetite change, chills, fever and unexpected weight change.  HENT:   Negative for hearing loss, lump/mass, mouth sores and trouble swallowing.   Eyes:  Negative for eye problems and icterus.  Respiratory:  Negative for chest tightness, cough and shortness of breath.   Cardiovascular:  Negative for chest pain, leg swelling and palpitations.  Gastrointestinal:  Negative for abdominal distention, abdominal pain, constipation, diarrhea, nausea and vomiting.  Endocrine: Negative for hot flashes.  Genitourinary:  Negative for difficulty urinating.   Musculoskeletal:  Negative for arthralgias.  Skin:  Negative for itching and rash.  Neurological:  Negative for dizziness, extremity weakness, headaches and numbness.  Hematological:  Negative for adenopathy. Does not bruise/bleed easily.  Psychiatric/Behavioral:  Negative for depression. The patient is not nervous/anxious.       PHYSICAL EXAMINATION    Vitals:   06/06/24 1328  BP: 130/71  Pulse: 87  Resp: 16  Temp: (!) 97.2 F (36.2 C)  SpO2: 100%    Physical Exam Constitutional:      General: She is not in acute distress.    Appearance: Normal appearance. She is not toxic-appearing.  HENT:     Head: Normocephalic and atraumatic.     Mouth/Throat:     Mouth: Mucous membranes are moist.     Pharynx: Oropharynx is clear. No oropharyngeal exudate or posterior oropharyngeal erythema.  Eyes:     General: No scleral icterus. Cardiovascular:     Rate and Rhythm: Normal rate and regular rhythm.     Pulses: Normal pulses.     Heart sounds: Normal heart sounds.  Pulmonary:     Effort: Pulmonary effort is normal.     Breath sounds: Normal breath sounds.  Abdominal:      General: Abdomen is flat. Bowel sounds are normal. There is no distension.     Palpations: Abdomen is soft.     Tenderness: There is no abdominal tenderness.  Musculoskeletal:        General: No swelling.     Cervical back: Neck supple.  Lymphadenopathy:     Cervical: No cervical adenopathy.  Skin:    General: Skin is warm and dry.     Findings: No rash.  Neurological:     General: No focal deficit present.     Mental Status: She is alert.  Psychiatric:        Mood and Affect: Mood normal.        Behavior: Behavior normal.     LABORATORY DATA:  CBC    Component Value Date/Time   WBC 4.9 06/06/2024 1303   RBC 3.49 (L) 06/06/2024 1303   HGB 10.6 (L) 06/06/2024 1303   HCT 31.3 (L) 06/06/2024 1303   PLT 247 06/06/2024 1303   MCV 89.7 06/06/2024 1303   MCH 30.4 06/06/2024 1303   MCHC 33.9 06/06/2024 1303   RDW 15.6 (H) 06/06/2024 1303   LYMPHSABS 1.6 06/06/2024 1303   MONOABS 0.2 06/06/2024 1303   EOSABS 0.1 06/06/2024 1303   BASOSABS 0.1 06/06/2024 1303    CMP     Component Value Date/Time   NA 137 06/06/2024 1303   K 4.2 06/06/2024 1303   CL 108 06/06/2024 1303   CO2 23 06/06/2024 1303   GLUCOSE 134 (H) 06/06/2024 1303   BUN 21 06/06/2024 1303   CREATININE 0.68 06/06/2024 1303   CALCIUM 9.0 06/06/2024 1303   PROT 6.7 06/06/2024 1303   ALBUMIN 4.1 06/06/2024 1303   AST 15 06/06/2024 1303   ALT 21 06/06/2024 1303   ALKPHOS 55 06/06/2024 1303   BILITOT 0.3 06/06/2024 1303   GFRNONAA >60 06/06/2024 1303     ASSESSMENT and THERAPY PLAN:   Malignant neoplasm of overlapping sites of right breast in female, estrogen receptor negative (HCC) 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%    CT CAP 04/12/2024: Right breast mass and prominent right axillary lymph nodes, calcified omental nodules (probably granulomatous disease)  peritoneal carcinomatosis not excluded.,  Left ovary lesion 3.8 cm Bone scan 04/13/2024: 2 foci of increased  uptake in the cervical spine region indeterminate recommend MRI spine Breast MRI 04/13/2024: Right breast non-mass enhancement 9 cm, 3 abnormal right axillary lymph nodes   Treatment plan: Neoadjuvant chemotherapy with Taxol  carbo Keytruda  weekly x 12 followed by Adriamycin Cytoxan Keytruda  followed by Keytruda  maintenance Breast conserving surgery with ALND Adjuvant radiation therapy   GYN evaluation for the ovarian adnexal lesion: Status post pelvic ultrasound.  Dr. Comer Dollar intends to do surgery in conjunction with breast surgery -------------------------------------------------------------------------------------------------------------------- Current treatment:  Taxol  carbo Keytruda  (started 04/19/2024) participation in S2205 clinical trial   Assessment and Plan Assessment & Plan Stage 2B triple negative breast cancer, right breast, on neoadjuvant chemotherapy Undergoing neoadjuvant chemotherapy with Taxol  and Carbo. Labs show good kidney and liver function. Hemoglobin slightly decreased, expected with chemotherapy. - Continue neoadjuvant chemotherapy with Taxol  and Carbo. - Scheduled chemotherapy sessions through the end of December. - Monitor hemoglobin levels and manage fatigue as needed.  Chemotherapy-induced anemia Hemoglobin levels slightly decreased, consistent with chemotherapy-induced anemia. Fatigue attributed to chemotherapy. - Continue to monitor hemoglobin levels and manage symptoms of fatigue.  Epistaxis and nasal dryness secondary to chemotherapy Nasal dryness and occasional epistaxis likely due to chemotherapy and dry air. Reports blood-tinged mucus without significant bleeding. - Use saline nasal spray in each nostril twice daily. - Monitor for significant bleeding and report if it occurs.  RTC in 1 week for labs, f/u, and treatment.   All questions  were answered. The patient knows to call the clinic with any problems, questions or concerns. We can certainly see the patient much sooner if necessary.  Total encounter time:20 minutes*in face-to-face visit time, chart review, lab review, care coordination, order entry, and documentation of the encounter time.    [name], NP 06/06/24 2:51 PM Medical Oncology and Hematology [facility] [address] [place], [place] [address] Tel. [phone]    Fax. [phone]  *Total Encounter Time as defined by the Centers for Medicare and Medicaid Services includes, in addition to the face-to-face time of a patient visit (documented in the note above) non-face-to-face time: obtaining and reviewing outside history, ordering and reviewing medications, tests or procedures, care coordination (communications with other health care professionals or caregivers) and documentation in the medical record.

## 2024-06-06 NOTE — Patient Instructions (Signed)
 [facility] - A DEPT OF [facility]  Discharge Instructions: Thank you for choosing [facility] to provide your oncology and hematology care.   If you have a lab appointment with the Cancer Center, please go directly to the Cancer Center and check in at the registration area.   Wear comfortable clothing and clothing appropriate for easy access to any Portacath or PICC line.   We strive to give you quality time with your provider. You may need to reschedule your appointment if you arrive late (15 or more minutes).  Arriving late affects you and other patients whose appointments are after yours.  Also, if you miss three or more appointments without notifying the office, you may be dismissed from the clinic at the provider's discretion.      For prescription refill requests, have your pharmacy contact our office and allow 72 hours for refills to be completed.    Today you received the following chemotherapy and/or immunotherapy agents: Paclitaxel  (Taxol ), & Carboplatin  (Paraplatin )    To help prevent nausea and vomiting after your treatment, we encourage you to take your nausea medication as directed.  BELOW ARE SYMPTOMS THAT SHOULD BE REPORTED IMMEDIATELY: *FEVER GREATER THAN 100.4 F (38 C) OR HIGHER *CHILLS OR SWEATING *NAUSEA AND VOMITING THAT IS NOT CONTROLLED WITH YOUR NAUSEA MEDICATION *UNUSUAL SHORTNESS OF BREATH *UNUSUAL BRUISING OR BLEEDING *URINARY PROBLEMS (pain or burning when urinating, or frequent urination) *BOWEL PROBLEMS (unusual diarrhea, constipation, pain near the anus) TENDERNESS IN MOUTH AND THROAT WITH OR WITHOUT PRESENCE OF ULCERS (sore throat, sores in mouth, or a toothache) UNUSUAL RASH, SWELLING OR PAIN  UNUSUAL VAGINAL DISCHARGE OR ITCHING   Items with * indicate a potential emergency and should be followed up as soon as possible or go to the Emergency Department if any problems should occur.  Please show the  CHEMOTHERAPY ALERT CARD or IMMUNOTHERAPY ALERT CARD at check-in to the Emergency Department and triage nurse.  Should you have questions after your visit or need to cancel or reschedule your appointment, please contact [facility] - A DEPT OF [facility]  Dept: [phone]  and follow the prompts.  Office hours are 8:00 a.m. to 4:30 p.m. Monday - Friday. Please note that voicemails left after 4:00 p.m. may not be returned until the following business day.  We are closed weekends and major holidays. You have access to a nurse at all times for urgent questions. Please call the main number to the clinic Dept: [phone] and follow the prompts.   For any non-urgent questions, you may also contact your provider using MyChart. We now offer e-Visits for anyone 29 and older to request care online for non-urgent symptoms. For details visit [email or web address].   Also download the MyChart app! Go to the app store, search MyChart, open the app, select [facility], and log in with your MyChart username and password.

## 2024-06-06 NOTE — Assessment & Plan Note (Signed)
 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%    CT CAP 04/12/2024: Right breast mass and prominent right axillary lymph nodes, calcified omental nodules (probably granulomatous disease) peritoneal carcinomatosis not excluded.,  Left ovary lesion 3.8 cm Bone scan 04/13/2024: 2 foci of increased uptake in the cervical spine region indeterminate recommend MRI spine Breast MRI 04/13/2024: Right breast non-mass enhancement 9 cm, 3 abnormal right axillary lymph nodes   Treatment plan: Neoadjuvant chemotherapy with Taxol  carbo Keytruda  weekly x 12 followed by Adriamycin Cytoxan Keytruda  followed by Keytruda  maintenance Breast conserving surgery with ALND Adjuvant radiation therapy   GYN evaluation for the ovarian adnexal lesion: Status post pelvic ultrasound.  Dr. Comer Dollar intends to do surgery in conjunction with breast surgery -------------------------------------------------------------------------------------------------------------------- Current treatment:  Taxol  carbo Keytruda  (started 04/19/2024) participation in S2205 clinical trial

## 2024-06-06 NOTE — Research (Unsigned)
 [name], ICE COMPRESS: RANDOMIZED TRIAL OF LIMB CRYOCOMPRESSION VERSUS CONTINUOUS COMPRESSION VERSUS LOW CYCLIC COMPRESSION FOR THE PREVENTION OF TAXANE-INDUCED PERIPHERAL NEUROPATHY    Week 8 Patient arrives today unaccompanied for the week 8 treatment. Confirmed patient does not have wounds, sores, or lesions to extremities. Patient has not had any vaccinations since last visit.   MD/PROVIDER VISIT: Patient sees [name] for today's visit.    ADVERSE EVENTS:   Solicited AEs reviewed with patient.  Christy Wiley denies any sensory or motor neuropathy symptoms. See AE table below     Adverse Event CTCAE Grade Onset date Resolved date Relationship to Study Intervention Action Taken Comments  Skin atrophy (solicited) 0            Skin hyperpigmentation (solicited) 0            Skin hypopigmentation (solicited) 0            Skin induration (solicited) 0            Skin ulceration (solicited) 0            Rash maculopapular (solicited) 0            Nail changes (solicited) 0            Cold intolerance (solicited) (general disorders and administration site conditions- other) 0            Frostbite (solicited) (skin and subcutaneous tissue disorders- other) 0            Nail discoloration (Non solicited)  1 05/09/24 Ongoing   None        STUDY INTERVENTION & TOLERABILITY ASSESSMENTS: 3:10 pm am Device wraps applied; pre-treatment started set on Arm 1 CyroCompression with temperature setting at 11 degree celsius and cyclic pressure 5-15 mm Hg.  3:20 pm Required 5-15 minute Tolerability check- pt states tolerable to cyrocompression with temperature setting at 11 degree celsius and cyclic pressure 5-15 mm Hg. 6:53 pm Taxane infusion started and moved to treatment phase on Paxman machine.  Cyrocompression with temperature setting at 11 degree celsius and cyclic pressure 5-15 mm Hg.  pm Bathroom break. Wraps removed  pm Wraps replaced and intervention restarted.   4:50 pmTaxane infusion end time.  Moved to post-treatment phase.  5:20 pm Wraps removed. Intervention ended. Pt made up bathroom break (6 minutes total) during post treatment phase per protocol. Confirmed no new wounds, sores, or lesions.      The patient was thanked for their time and continued voluntary participation in this study. Christy Wiley understands research nurse will meet her again at next scheduled taxane infusion to apply study device. [name] has been provided direct contact information and is encouraged to contact this Nurse for any needs or question.   [name], RN, BSN Clinical Research Nurse [phone] 06/06/2024

## 2024-06-07 ENCOUNTER — Other Ambulatory Visit: Payer: Self-pay

## 2024-06-07 ENCOUNTER — Encounter: Payer: Self-pay | Admitting: Hematology and Oncology

## 2024-06-08 ENCOUNTER — Inpatient Hospital Stay: Admitting: Gynecologic Oncology

## 2024-06-08 ENCOUNTER — Encounter: Payer: Self-pay | Admitting: Gynecologic Oncology

## 2024-06-09 ENCOUNTER — Encounter: Payer: Self-pay | Admitting: Gynecologic Oncology

## 2024-06-09 ENCOUNTER — Inpatient Hospital Stay: Admitting: Gynecologic Oncology

## 2024-06-09 DIAGNOSIS — R1900 Intra-abdominal and pelvic swelling, mass and lump, unspecified site: Secondary | ICD-10-CM | POA: Diagnosis not present

## 2024-06-09 DIAGNOSIS — C50811 Malignant neoplasm of overlapping sites of right female breast: Secondary | ICD-10-CM

## 2024-06-09 DIAGNOSIS — Z171 Estrogen receptor negative status [ER-]: Secondary | ICD-10-CM | POA: Diagnosis not present

## 2024-06-09 NOTE — Progress Notes (Signed)
 Gynecologic Oncology Telehealth Note: Gyn-Onc  I connected with Christy Wiley on 06/09/24 at  6:00 PM EST by telephone and verified that I am speaking with the correct person using two identifiers.  I discussed the limitations, risks, security and privacy concerns of performing an evaluation and management service by telemedicine and the availability of in-person appointments. I also discussed with the patient that there may be a patient responsible charge related to this service. The patient expressed understanding and agreed to proceed.  Other persons participating in the visit and their role in the encounter: none.  Patient's location: [place] Provider's location:   Reason for Visit: follow-up  Treatment History: Oncology History  Malignant neoplasm of overlapping sites of right breast in female, estrogen receptor negative (HCC)  03/28/2024 Initial Diagnosis   Screening mammogram detected right breast asymmetry, 2 lesions 1.9 cm at 10:00 and 0.7 cm at the 11:00: Biopsy: 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  The report date is April 19, 2024.  The CancerNext-Expanded gene panel offered by [facility] and includes sequencing, rearrangement, and RNA analysis for the following 77 genes: AIP, ALK, APC, ATM, BAP1, BARD1, BMPR1A, BRCA1, BRCA2, BRIP1, CDC73, CDH1, CDK4, CDKN1B, CDKN2A, CEBPA, CHEK2, CTNNA1, DDX41,  DICER1, ETV6, FH, FLCN, GATA2, LZTR1, MAX, MBD4, MEN1, MET, MLH1, MSH2, MSH3, MSH6, MUTYH, NF1, NF2, NTHL1, PALB2, PHOX2B, PMS2, POT1, PRKAR1A, PTCH1, PTEN, RAD51C, RAD51D, RB1, RET, RPS20, RUNX1, SDHA, SDHAF2, SDHB, SDHC, SDHD, SMAD4, SMARCA4, SMARCB1, SMARCE1, STK11, SUFU, TMEM127, TP53, TSC1, TSC2, VHL, and WT1 (sequencing and deletion/duplication); AXIN2, CTNNA1, DDX41, EGFR, HOXB13, KIT, MBD4, MITF, MSH3, PDGFRA, POLD1 and POLE (sequencing only); EPCAM and GREM1 (deletion/duplication only). RNA data is routinely analyzed for use in variant interpretation for all genes.     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   CT of the chest, abdomen, and pelvis on 04/12/2024 for staging purposes for new diagnosis of breast cancer shows mass in the lateral right breast, asymmetric prominent right axillary lymph nodes consistent with biopsy-proven nodal metastatic disease.  Multiple densely calcified omental nodules and calcified nodules scattered throughout the low pelvis, nonspecific and may represent prior granulomatous disease however treated peritoneal carcinomatosis not excluded.  Low intermediate attenuation lesion of the left ovary or adnexa measures up to 3.8 cm.  Incompletely characterized.   She began neoadjuvant chemotherapy with pembrolizumab , carboplatin , and paclitaxel  in mid September.   Germline genetic testing on 9/17 is negative for pathogenic mutation.   Pelvic ultrasound on 05/11/2024: Uterus is surgically absent.  Right ovary not visualized.  3.5 x 3.3 x 2.4 cm ovoid homogeneous solid-appearing isoechoic mass within the left adnexa with mild internal vascularity.  Adjacent to abutting left ovary measures up to 1.3 cm.  Further characterization with MRI encouraged.  Interval History: Doing well, denies any new symptoms.  Past Medical/Surgical History: Past Medical History:  Diagnosis Date   Anemia  Arthritis    Blood transfusion without  reported diagnosis    Cancer (HCC)    Diabetes (HCC)    Family history of ovarian cancer    Family history of prostate cancer    Fibroid    Vaginal Pap smear, abnormal     Past Surgical History:  Procedure Laterality Date   ABDOMINAL HYSTERECTOMY     fibroids, left adnexa   BREAST BIOPSY Right 03/28/2024   US  RT BREAST BX W LOC DEV 1ST LESION IMG BX SPEC US  GUIDE 03/28/2024 [facility]   BREAST BIOPSY Right 03/28/2024   US  RT BREAST BX W LOC DEV EA ADD LESION IMG BX SPEC US  GUIDE 03/28/2024 [facility]   PORTACATH PLACEMENT Left 04/14/2024   Procedure: INSERTION, TUNNELED CENTRAL VENOUS DEVICE, WITH PORT;  Surgeon: Curvin Deward MOULD, MD;  Location: [facility];  Service: General;  Laterality: Left;  PORT PLACEMENT WITH ULTRASOUND GUIDANCE    Family History  Problem Relation Age of Onset   Cancer Brother    Prostate cancer Brother    Ovarian cancer Maternal Aunt    Cervical cancer Maternal Aunt    Breast cancer Neg Hx     Social History   Socioeconomic History   Marital status: Single    Spouse name: Not on file   Number of children: 0   Years of education: Not on file   Highest education level: Not on file  Occupational History   Not on file  Tobacco Use   Smoking status: Former    Current packs/day: 0.00    Types: Cigarettes    Quit date: 01/1996    Years since quitting: 28.4   Smokeless tobacco: Never  Vaping Use   Vaping status: Never Used  Substance and Sexual Activity   Alcohol  use: Never   Drug use: Never   Sexual activity: Not Currently  Other Topics Concern   Not on file  Social History Narrative   Not on file   Social Drivers of Health   Financial Resource Strain: Not on file  Food Insecurity: No Food Insecurity (04/05/2024)   Hunger Vital Sign    Worried About Running Out of Food in the Last Year: Never true    Ran Out of Food in the Last Year: Never true  Transportation Needs: No Transportation Needs (04/05/2024)    PRAPARE - Administrator, Civil Service (Medical): No    Lack of Transportation (Non-Medical): No  Physical Activity: Not on file  Stress: Not on file  Social Connections: Not on file    Current Medications:  Current Outpatient Medications:    atorvastatin (LIPITOR) 40 MG tablet, Take 40 mg by mouth., Disp: , Rfl:    dapagliflozin propanediol (FARXIGA) 10 MG TABS tablet, Take 10 mg by mouth daily., Disp: , Rfl:    lidocaine -prilocaine  (EMLA ) cream, Apply to affected area once, Disp: 30 g, Rfl: 3   lisinopril (ZESTRIL) 5 MG tablet, Take 5 mg by mouth., Disp: , Rfl:    meloxicam (MOBIC) 7.5 MG tablet, Take 7.5 mg by mouth daily as needed., Disp: , Rfl:    ondansetron  (ZOFRAN ) 8 MG tablet, Take 1 tablet (8 mg total) by mouth every 8 (eight) hours as needed for nausea or vomiting. Start on the third day after chemotherapy., Disp: 30 tablet, Rfl: 1   oxyCODONE  (ROXICODONE ) 5 MG immediate release tablet, Take 1 tablet (5 mg total) by mouth every 6 (six) hours as needed., Disp: 10 tablet, Rfl: 0  pantoprazole  (PROTONIX ) 40 MG tablet, Take 1 tablet (40 mg total) by mouth daily., Disp: 30 tablet, Rfl: 3   prochlorperazine  (COMPAZINE ) 10 MG tablet, Take 1 tablet (10 mg total) by mouth every 6 (six) hours as needed for nausea or vomiting., Disp: 30 tablet, Rfl: 1  Review of Symptoms: Pertinent positives as per HPI.  Physical Exam: Deferred given limitations of phone visit.  Laboratory & Radiologic Studies: Component Ref Range & Units (hover) 2 wk ago  Cancer Antigen (CA) 125 12.2    Assessment & Plan: Christy Wiley is a 73 y.o. woman with breast cancer and a complex adnexal mass.  Patient remains asymptomatic.  We had talked about an MRI at her last visit to better characterize the ovarian mass.  She ultimately decided against having this done.  CA125 was drawn with recent labs and normal.  Patient continues to prefer waiting until she has finished chemotherapy and is discussing  surgery for her breast to potentially plan surgery for her adnexal mass.  I will reach out to Darice to help coordinate getting her scheduled at least for telephone visit with me around this time.  We discussed again that if this were ovarian cancer, which I suspect it is not, that her current chemotherapy regimen would be the treatment used for ovarian cancer.  Asked the patient to contact me if she develops any new symptoms such as pelvic pain, unintentional weight loss.  I discussed the assessment and treatment plan with the patient. The patient was provided with an opportunity to ask questions and all were answered. The patient agreed with the plan and demonstrated an understanding of the instructions.   The patient was advised to call back or see an in-person evaluation if the symptoms worsen or if the condition fails to improve as anticipated.   8 minutes of total time was spent for this patient encounter, including preparation, phone counseling with the patient and coordination of care, and documentation of the encounter.   Comer Dollar, MD  Division of Gynecologic Oncology  Department of Obstetrics and Gynecology  [facility]

## 2024-06-13 ENCOUNTER — Telehealth: Payer: Self-pay | Admitting: Oncology

## 2024-06-13 ENCOUNTER — Inpatient Hospital Stay

## 2024-06-13 ENCOUNTER — Encounter: Payer: Self-pay | Admitting: Hematology and Oncology

## 2024-06-13 ENCOUNTER — Other Ambulatory Visit: Payer: Self-pay | Admitting: *Deleted

## 2024-06-13 ENCOUNTER — Encounter: Payer: Self-pay | Admitting: *Deleted

## 2024-06-13 ENCOUNTER — Inpatient Hospital Stay: Admitting: Hematology and Oncology

## 2024-06-13 VITALS — BP 112/68 | HR 72 | Temp 97.6°F | Resp 18 | Ht 63.0 in | Wt 142.6 lb

## 2024-06-13 DIAGNOSIS — C50811 Malignant neoplasm of overlapping sites of right female breast: Secondary | ICD-10-CM

## 2024-06-13 DIAGNOSIS — Z171 Estrogen receptor negative status [ER-]: Secondary | ICD-10-CM

## 2024-06-13 DIAGNOSIS — Z5111 Encounter for antineoplastic chemotherapy: Secondary | ICD-10-CM | POA: Diagnosis not present

## 2024-06-13 LAB — CMP (CANCER CENTER ONLY)
ALT: 10 U/L (ref 0–44)
AST: 11 U/L — ABNORMAL LOW (ref 15–41)
Albumin: 3.9 g/dL (ref 3.5–5.0)
Alkaline Phosphatase: 47 U/L (ref 38–126)
Anion gap: 6 (ref 5–15)
BUN: 17 mg/dL (ref 8–23)
CO2: 25 mmol/L (ref 22–32)
Calcium: 8.8 mg/dL — ABNORMAL LOW (ref 8.9–10.3)
Chloride: 108 mmol/L (ref 98–111)
Creatinine: 0.64 mg/dL (ref 0.44–1.00)
GFR, Estimated: >60 mL/min (ref >60)
Glucose, Bld: 143 mg/dL — ABNORMAL HIGH (ref 70–99)
Potassium: 4 mmol/L (ref 3.5–5.1)
Sodium: 139 mmol/L (ref 135–145)
Total Bilirubin: 0.4 mg/dL (ref 0.0–1.2)
Total Protein: 6.5 g/dL (ref 6.5–8.1)

## 2024-06-13 LAB — CBC WITH DIFFERENTIAL (CANCER CENTER ONLY)
Abs Immature Granulocytes: 0 K/uL (ref 0.00–0.07)
Basophils Absolute: 0 K/uL (ref 0.0–0.1)
Basophils Relative: 1 %
Eosinophils Absolute: 0.1 K/uL (ref 0.0–0.5)
Eosinophils Relative: 3 %
HCT: 31.4 % — ABNORMAL LOW (ref 36.0–46.0)
Hemoglobin: 10.6 g/dL — ABNORMAL LOW (ref 12.0–15.0)
Immature Granulocytes: 0 %
Lymphocytes Relative: 36 %
Lymphs Abs: 1.5 K/uL (ref 0.7–4.0)
MCH: 30.6 pg (ref 26.0–34.0)
MCHC: 33.8 g/dL (ref 30.0–36.0)
MCV: 90.8 fL (ref 80.0–100.0)
Monocytes Absolute: 0.3 K/uL (ref 0.1–1.0)
Monocytes Relative: 7 %
Neutro Abs: 2.2 K/uL (ref 1.7–7.7)
Neutrophils Relative %: 53 %
Platelet Count: 304 K/uL (ref 150–400)
RBC: 3.46 MIL/uL — ABNORMAL LOW (ref 3.87–5.11)
RDW: 16.4 % — ABNORMAL HIGH (ref 11.5–15.5)
WBC Count: 4.2 K/uL (ref 4.0–10.5)
nRBC: 0 % (ref 0.0–0.2)

## 2024-06-13 MED ORDER — DEXAMETHASONE SOD PHOSPHATE PF 10 MG/ML IJ SOLN
10.0000 mg | Freq: Once | INTRAMUSCULAR | Status: AC
  Administered 2024-06-13: 10 mg via INTRAVENOUS

## 2024-06-13 MED ORDER — PALONOSETRON HCL INJECTION 0.25 MG/5ML
0.2500 mg | Freq: Once | INTRAVENOUS | Status: AC
  Administered 2024-06-13: 0.25 mg via INTRAVENOUS
  Filled 2024-06-13: qty 5

## 2024-06-13 MED ORDER — DIPHENHYDRAMINE HCL 50 MG/ML IJ SOLN
50.0000 mg | Freq: Once | INTRAMUSCULAR | Status: AC
  Administered 2024-06-13: 50 mg via INTRAVENOUS
  Filled 2024-06-13: qty 1

## 2024-06-13 MED ORDER — FAMOTIDINE IN NACL 20-0.9 MG/50ML-% IV SOLN
20.0000 mg | Freq: Once | INTRAVENOUS | Status: AC
  Administered 2024-06-13: 20 mg via INTRAVENOUS
  Filled 2024-06-13: qty 50

## 2024-06-13 MED ORDER — SODIUM CHLORIDE 0.9 % IV SOLN: INTRAVENOUS | Status: DC

## 2024-06-13 MED ORDER — SODIUM CHLORIDE 0.9 % IV SOLN
65.0000 mg/m2 | Freq: Once | INTRAVENOUS | Status: AC
  Administered 2024-06-13: 114 mg via INTRAVENOUS
  Filled 2024-06-13: qty 19

## 2024-06-13 MED ORDER — SODIUM CHLORIDE 0.9 % IV SOLN
117.9000 mg | Freq: Once | INTRAVENOUS | Status: AC
  Administered 2024-06-13: 120 mg via INTRAVENOUS
  Filled 2024-06-13: qty 12

## 2024-06-13 NOTE — Progress Notes (Signed)
 Patient Care Team: [name], MD as PCP - General (Family Medicine) Gerome, Devere HERO, RN as Oncology Nurse Navigator Tyree Nanetta SAILOR, RN as Oncology Nurse Navigator Odean Potts, MD as Consulting Physician (Hematology and Oncology) Dewey Rush, MD as Consulting Physician (Radiation Oncology) Curvin Deward MOULD, MD as Consulting Physician (General Surgery)  DIAGNOSIS:  Encounter Diagnosis  Name Primary?   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  The report date is April 19, 2024.  The CancerNext-Expanded gene panel offered by [facility] and includes sequencing, rearrangement, and RNA analysis for the following 77 genes: AIP, ALK, APC, ATM, BAP1, BARD1, BMPR1A, BRCA1, BRCA2, BRIP1, CDC73, CDH1, CDK4, CDKN1B, CDKN2A, CEBPA, CHEK2, CTNNA1, DDX41, DICER1, ETV6, FH, FLCN, GATA2, LZTR1, MAX, MBD4, MEN1, MET, MLH1, MSH2, MSH3, MSH6, MUTYH, NF1, NF2, NTHL1, PALB2, PHOX2B, PMS2, POT1, PRKAR1A, PTCH1, PTEN, RAD51C, RAD51D, RB1,  RET, RPS20, RUNX1, SDHA, SDHAF2, SDHB, SDHC, SDHD, SMAD4, SMARCA4, SMARCB1, SMARCE1, STK11, SUFU, TMEM127, TP53, TSC1, TSC2, VHL, and WT1 (sequencing and deletion/duplication); AXIN2, CTNNA1, DDX41, EGFR, HOXB13, KIT, MBD4, MITF, MSH3, PDGFRA, POLD1 and POLE (sequencing only); EPCAM and GREM1 (deletion/duplication only). RNA data is routinely analyzed for use in variant interpretation for all genes.      CHIEF COMPLIANT: Cycle 9 Taxol  carboplatin   HISTORY OF PRESENT ILLNESS:  History of Present Illness Christy Wiley is a 73 year old female with cancer undergoing chemotherapy who presents for her ninth treatment session.  She is undergoing chemotherapy with Taxol  and Carboplatin . She experiences mild fatigue, which she attributes to the cumulative effects of the treatment. Neuropathy is present, with numbness in the fingertips, consistent with her diabetes history, but it has not worsened or affected her feet. She has difficulty with flushing her port, a recurring issue.     ALLERGIES:  is allergic to penicillin g.  MEDICATIONS:  Current Outpatient Medications  Medication Sig Dispense Refill   atorvastatin (LIPITOR) 40 MG tablet Take 40 mg by mouth.     cholecalciferol (VITAMIN D3) 25 MCG (1000 UNIT) tablet Take 1,000 Units by mouth daily.     dapagliflozin propanediol (FARXIGA) 10 MG TABS tablet Take 10 mg by mouth daily.     lidocaine -prilocaine  (EMLA ) cream Apply to affected area once 30 g 3   lisinopril (ZESTRIL) 5 MG tablet Take 5 mg by mouth.     meloxicam (MOBIC) 7.5 MG tablet Take 7.5 mg by mouth daily as needed.     ondansetron  (ZOFRAN ) 8 MG tablet Take 1 tablet (8 mg total) by mouth every 8 (eight)  hours as needed for nausea or vomiting. Start on the third day after chemotherapy. 30 tablet 1   oxyCODONE  (ROXICODONE ) 5 MG immediate release tablet Take 1 tablet (5 mg total) by mouth every 6 (six) hours as needed. 10 tablet 0   pantoprazole  (PROTONIX ) 40 MG tablet Take 1 tablet (40  mg total) by mouth daily. 30 tablet 3   prochlorperazine  (COMPAZINE ) 10 MG tablet Take 1 tablet (10 mg total) by mouth every 6 (six) hours as needed for nausea or vomiting. 30 tablet 1   No current facility-administered medications for this visit.    PHYSICAL EXAMINATION: ECOG PERFORMANCE STATUS: 1 - Symptomatic but completely ambulatory  Vitals:   06/13/24 1332  BP: 112/68  Pulse: 72  Resp: 18  Temp: 97.6 F (36.4 C)  SpO2: 100%   Filed Weights   06/13/24 1332  Weight: 142 lb 9.6 oz (64.7 kg)     LABORATORY DATA:  I have reviewed the data as listed    Latest Ref Rng & Units 06/06/2024    1:03 PM 05/30/2024   11:40 AM 05/23/2024   12:27 PM  CMP  Glucose 70 - 99 mg/dL 865  831  861   BUN 8 - 23 mg/dL 21  14  15    Creatinine 0.44 - 1.00 mg/dL 9.31  9.28  9.35   Sodium 135 - 145 mmol/L 137  139  140   Potassium 3.5 - 5.1 mmol/L 4.2  4.2  3.9   Chloride 98 - 111 mmol/L 108  109  111   CO2 22 - 32 mmol/L 23  26  25    Calcium 8.9 - 10.3 mg/dL 9.0  8.9  9.2   Total Protein 6.5 - 8.1 g/dL 6.7  6.8  6.5   Total Bilirubin 0.0 - 1.2 mg/dL 0.3  0.3  0.4   Alkaline Phos 38 - 126 U/L 55  70  49   AST 15 - 41 U/L 15  14  11    ALT 0 - 44 U/L 21  15  11      Lab Results  Component Value Date   WBC 4.2 06/13/2024   HGB 10.6 (L) 06/13/2024   HCT 31.4 (L) 06/13/2024   MCV 90.8 06/13/2024   PLT 304 06/13/2024   NEUTROABS 2.2 06/13/2024    ASSESSMENT & PLAN:  Malignant neoplasm of overlapping sites of right breast in female, estrogen receptor negative (HCC) 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%    CT CAP 04/12/2024: Right breast mass and prominent right axillary lymph nodes, calcified omental nodules (probably granulomatous disease) peritoneal carcinomatosis not excluded.,  Left ovary lesion 3.8 cm Bone scan 04/13/2024: 2 foci of increased  uptake in the cervical spine region indeterminate recommend MRI spine Breast MRI 04/13/2024: Right breast non-mass enhancement 9 cm, 3 abnormal right axillary lymph nodes   Treatment plan: Neoadjuvant chemotherapy with Taxol  carbo Keytruda  weekly x 12 followed by Adriamycin Cytoxan Keytruda  followed by Keytruda  maintenance Breast conserving surgery with ALND Adjuvant radiation therapy   GYN evaluation for the ovarian adnexal lesion: Status post pelvic ultrasound.  Dr. Comer Dollar intends to do surgery in conjunction with breast surgery -------------------------------------------------------------------------------------------------------------------- Current treatment: Cycle 9 Taxol  carbo Keytruda  (started 04/19/2024) participation in S2205 clinical trial   Chemo toxicities: Fatigue: Mild Slight leukopenia: I reduced the dosage of Taxol  Nailbed discoloration Chemotherapy-induced anemia Peripheral neuropathy: Stable (  previous mild diabetic peripheral neuropathy)   Otherwise tolerating chemo fairly well. Return to clinic weekly for chemo ------------------------------------- Assessment and Plan Assessment & Plan Malignant neoplasm of overlapping sites of right breast Currently on Taxol  and Carbo chemotherapy. Surgery deemed essential due to 15% recurrence risk without removal. Radiation therapy planned post-chemotherapy. Clinical trial for surgery avoidance considered if chemotherapy eradicates disease, but not available yet. - Continue Taxol  and Carbo chemotherapy. - Plan surgery post-chemotherapy. - Plan four-week radiation therapy post-chemotherapy. - Consider clinical trial for surgery avoidance if chemotherapy eradicates disease.  Chemotherapy-induced anemia Hemoglobin at 10.6, indicating mild anemia. Anemia expected to worsen with ongoing chemotherapy, contributing to fatigue. - Monitor hemoglobin levels regularly. - Educated on signs of worsening anemia and  fatigue.  Chemotherapy-induced fatigue Fatigue cumulative with ongoing chemotherapy, mild but may worsen. - Monitor fatigue levels. - Encouraged hydration and rest.  Chemotherapy port management Difficulty flushing port, possibly due to position against vessel wall, requiring positional adjustments. - Adjust port position during flushing as needed. - Monitor for port function complications.      No orders of the defined types were placed in this encounter.  The patient has a good understanding of the overall plan. she agrees with it. she will call with any problems that may develop before the next visit here.  I personally spent a total of 30 minutes in the care of the patient today including preparing to see the patient, getting/reviewing separately obtained history, performing a medically appropriate exam/evaluation, counseling and educating, placing orders, referring and communicating with other health care professionals, documenting clinical information in the EHR, independently interpreting results, communicating results, and coordinating care.   Viinay K Anjali Manzella, MD 06/13/24

## 2024-06-13 NOTE — Assessment & Plan Note (Signed)
 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%    CT CAP 04/12/2024: Right breast mass and prominent right axillary lymph nodes, calcified omental nodules (probably granulomatous disease) peritoneal carcinomatosis not excluded.,  Left ovary lesion 3.8 cm Bone scan 04/13/2024: 2 foci of increased uptake in the cervical spine region indeterminate recommend MRI spine Breast MRI 04/13/2024: Right breast non-mass enhancement 9 cm, 3 abnormal right axillary lymph nodes   Treatment plan: Neoadjuvant chemotherapy with Taxol  carbo Keytruda  weekly x 12 followed by Adriamycin Cytoxan Keytruda  followed by Keytruda  maintenance Breast conserving surgery with ALND Adjuvant radiation therapy   GYN evaluation for the ovarian adnexal lesion: Status post pelvic ultrasound.  Dr. Comer Dollar intends to do surgery in conjunction with breast surgery -------------------------------------------------------------------------------------------------------------------- Current treatment: Cycle 9 Taxol  carbo Keytruda  (started 04/19/2024) participation in S2205 clinical trial   Chemo toxicities: Fatigue Slight leukopenia: I reduced the dosage of Taxol  Nailbed discoloration Chemotherapy-induced anemia   Otherwise tolerating chemo fairly well. Return to clinic weekly for chemo and every other week for follow-up with me

## 2024-06-13 NOTE — Research (Unsigned)
 [name], ICE COMPRESS: RANDOMIZED TRIAL OF LIMB CRYOCOMPRESSION VERSUS CONTINUOUS COMPRESSION VERSUS LOW CYCLIC COMPRESSION FOR THE PREVENTION OF TAXANE-INDUCED PERIPHERAL NEUROPATHY    Week 9 Patient arrives today unaccompanied for the week 9 treatment. Confirmed patient does not have wounds, sores, or lesions to extremities. Patient has not had any vaccinations since last visit.   MD/PROVIDER VISIT: Patient sees Dr.Gudena for today's visit.    ADVERSE EVENTS:   Solicited AEs reviewed with patient.  Pt stated that she is not experiencing any numbness in her fingertips today. She denies any new AE symptoms. See AE table below.      Adverse Event CTCAE Grade Onset date Resolved date Relationship to Study Intervention Action Taken Comments  Skin atrophy (solicited) 0            Skin hyperpigmentation (solicited) 0            Skin hypopigmentation (solicited) 0            Skin induration (solicited) 0            Skin ulceration (solicited) 0            Rash maculopapular (solicited) 0            Nail changes (solicited) 0            Cold intolerance (solicited) (general disorders and administration site conditions- other) 0            Frostbite (solicited) (skin and subcutaneous tissue disorders- other) 0            Nail discoloration (Non solicited)  1 05/09/24 Ongoing   None    Peripheral sensory neuropathy (Non solicited) 1 05/30/24 06/06/24 unrelated None        STUDY INTERVENTION & TOLERABILITY ASSESSMENTS: 2:47 pm am Device wraps applied; pre-treatment started set on Arm 1 CyroCompression with temperature setting at 11 degree celsius and cyclic pressure 5-15 mm Hg.  2:57 pm Required 5-15 minute Tolerability check- pt states tolerable to cyrocompression with temperature setting at 11 degree celsius and cyclic pressure 5-15 mm Hg. 6:73 pm Taxane infusion started and moved to treatment phase on Paxman machine.  Cyrocompression with temperature setting at 11 degree celsius and cyclic  pressure 5-15 mm Hg. 4:02 pm Bathroom break. Wraps removed 4:10 pm Wraps replaced and intervention restarted.  4:30 pmTaxane infusion end time. Moved to post-treatment phase. 5:08 pm Wraps removed. Intervention ended. Pt made up bathroom break (8 minutes total) during post treatment phase per protocol. Confirmed no new wounds, sores, or lesions.      The patient was thanked for their time and continued voluntary participation in this study. She understands research nurse will meet her again at next scheduled taxane infusion to apply study device. [name] has been provided direct contact information and is encouraged to contact this Nurse for any needs or question.   [name], RN, BSN Clinical Research Nurse [phone] 06/13/2024

## 2024-06-13 NOTE — Patient Instructions (Signed)
 [facility] - A DEPT OF [facility]  Discharge Instructions: Thank you for choosing [facility] to provide your oncology and hematology care.   If you have a lab appointment with the Cancer Center, please go directly to the Cancer Center and check in at the registration area.   Wear comfortable clothing and clothing appropriate for easy access to any Portacath or PICC line.   We strive to give you quality time with your provider. You may need to reschedule your appointment if you arrive late (15 or more minutes).  Arriving late affects you and other patients whose appointments are after yours.  Also, if you miss three or more appointments without notifying the office, you may be dismissed from the clinic at the provider's discretion.      For prescription refill requests, have your pharmacy contact our office and allow 72 hours for refills to be completed.    Today you received the following chemotherapy and/or immunotherapy agents: Paclitaxel  (Taxol ), & Carboplatin  (Paraplatin )    To help prevent nausea and vomiting after your treatment, we encourage you to take your nausea medication as directed.  BELOW ARE SYMPTOMS THAT SHOULD BE REPORTED IMMEDIATELY: *FEVER GREATER THAN 100.4 F (38 C) OR HIGHER *CHILLS OR SWEATING *NAUSEA AND VOMITING THAT IS NOT CONTROLLED WITH YOUR NAUSEA MEDICATION *UNUSUAL SHORTNESS OF BREATH *UNUSUAL BRUISING OR BLEEDING *URINARY PROBLEMS (pain or burning when urinating, or frequent urination) *BOWEL PROBLEMS (unusual diarrhea, constipation, pain near the anus) TENDERNESS IN MOUTH AND THROAT WITH OR WITHOUT PRESENCE OF ULCERS (sore throat, sores in mouth, or a toothache) UNUSUAL RASH, SWELLING OR PAIN  UNUSUAL VAGINAL DISCHARGE OR ITCHING   Items with * indicate a potential emergency and should be followed up as soon as possible or go to the Emergency Department if any problems should occur.  Please show the  CHEMOTHERAPY ALERT CARD or IMMUNOTHERAPY ALERT CARD at check-in to the Emergency Department and triage nurse.  Should you have questions after your visit or need to cancel or reschedule your appointment, please contact [facility] - A DEPT OF [facility]  Dept: [phone]  and follow the prompts.  Office hours are 8:00 a.m. to 4:30 p.m. Monday - Friday. Please note that voicemails left after 4:00 p.m. may not be returned until the following business day.  We are closed weekends and major holidays. You have access to a nurse at all times for urgent questions. Please call the main number to the clinic Dept: [phone] and follow the prompts.   For any non-urgent questions, you may also contact your provider using MyChart. We now offer e-Visits for anyone 29 and older to request care online for non-urgent symptoms. For details visit [email or web address].   Also download the MyChart app! Go to the app store, search MyChart, open the app, select [facility], and log in with your MyChart username and password.

## 2024-06-13 NOTE — Telephone Encounter (Signed)
 Called [facility] and scheduled a telephone visit with Dr. Viktoria on 09/28/23 at 6:00.

## 2024-06-14 ENCOUNTER — Other Ambulatory Visit: Payer: Self-pay | Admitting: *Deleted

## 2024-06-14 ENCOUNTER — Encounter: Payer: Self-pay | Admitting: Hematology and Oncology

## 2024-06-14 ENCOUNTER — Other Ambulatory Visit: Payer: Self-pay

## 2024-06-14 ENCOUNTER — Inpatient Hospital Stay

## 2024-06-14 VITALS — BP 144/83 | HR 83 | Resp 17

## 2024-06-14 DIAGNOSIS — C50811 Malignant neoplasm of overlapping sites of right female breast: Secondary | ICD-10-CM

## 2024-06-14 DIAGNOSIS — Z5111 Encounter for antineoplastic chemotherapy: Secondary | ICD-10-CM | POA: Diagnosis not present

## 2024-06-14 MED ORDER — FILGRASTIM-SNDZ 300 MCG/0.5ML IJ SOSY
300.0000 ug | PREFILLED_SYRINGE | Freq: Once | INTRAMUSCULAR | Status: AC
  Administered 2024-06-14: 300 ug via SUBCUTANEOUS
  Filled 2024-06-14: qty 0.5

## 2024-06-15 ENCOUNTER — Encounter: Payer: Self-pay | Admitting: General Practice

## 2024-06-15 ENCOUNTER — Inpatient Hospital Stay

## 2024-06-15 VITALS — BP 116/67 | HR 92 | Temp 99.2°F | Resp 16

## 2024-06-15 DIAGNOSIS — C50811 Malignant neoplasm of overlapping sites of right female breast: Secondary | ICD-10-CM

## 2024-06-15 DIAGNOSIS — Z5111 Encounter for antineoplastic chemotherapy: Secondary | ICD-10-CM | POA: Diagnosis not present

## 2024-06-15 MED ORDER — FILGRASTIM-SNDZ 300 MCG/0.5ML IJ SOSY
300.0000 ug | PREFILLED_SYRINGE | Freq: Once | INTRAMUSCULAR | Status: AC
  Administered 2024-06-15: 300 ug via SUBCUTANEOUS
  Filled 2024-06-15: qty 0.5

## 2024-06-15 NOTE — Progress Notes (Signed)
 SPIRITUAL CARE AND COUNSELING CONSULT NOTE   VISIT SUMMARY Followed up with Ms Wedeking by phone per her request for continued Spiritual Care check-ins. She values prayer and spiritual encouragement, reporting that she is doing great with treatment and thanks God for how well she is being cared for.   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 Patient Stress Factors:  (Caregiving on top of her own treatment)   GOALS   Self/Personal Goals: I love your calls/Keep me in prayer Clinical Care Goals: Continued prayer/spiritual/emotional support   INTERVENTIONS   Spiritual Care Interventions Made: Compassionate presence, Reflective listening, Explored values/beliefs/practices/strengths, Meaning making   INTERVENTION OUTCOMES   Outcomes: Connection to values and goals of care, Awareness of support, Reduced isolation  SPIRITUAL CARE PLAN   Spiritual Care Issues Still Outstanding: Chaplain will continue to follow Follow up plan : Phone again next month for Spiritual Care check-in    Chaplain Olam Corrigan, [place], [facility] Pager [phone] Voicemail [phone]

## 2024-06-16 ENCOUNTER — Inpatient Hospital Stay

## 2024-06-16 VITALS — BP 123/77 | HR 86 | Temp 98.8°F | Resp 16

## 2024-06-16 DIAGNOSIS — Z5111 Encounter for antineoplastic chemotherapy: Secondary | ICD-10-CM | POA: Diagnosis not present

## 2024-06-16 DIAGNOSIS — Z171 Estrogen receptor negative status [ER-]: Secondary | ICD-10-CM

## 2024-06-16 MED ORDER — FILGRASTIM-SNDZ 300 MCG/0.5ML IJ SOSY
300.0000 ug | PREFILLED_SYRINGE | Freq: Once | INTRAMUSCULAR | Status: AC
  Administered 2024-06-16: 300 ug via SUBCUTANEOUS
  Filled 2024-06-16: qty 0.5

## 2024-06-20 ENCOUNTER — Encounter: Payer: Self-pay | Admitting: *Deleted

## 2024-06-20 ENCOUNTER — Inpatient Hospital Stay (HOSPITAL_BASED_OUTPATIENT_CLINIC_OR_DEPARTMENT_OTHER): Admitting: Hematology and Oncology

## 2024-06-20 ENCOUNTER — Inpatient Hospital Stay

## 2024-06-20 ENCOUNTER — Encounter: Payer: Self-pay | Admitting: Hematology and Oncology

## 2024-06-20 VITALS — BP 119/71 | HR 93 | Temp 97.3°F | Resp 18 | Ht 63.0 in | Wt 144.7 lb

## 2024-06-20 DIAGNOSIS — Z171 Estrogen receptor negative status [ER-]: Secondary | ICD-10-CM | POA: Diagnosis not present

## 2024-06-20 DIAGNOSIS — C50811 Malignant neoplasm of overlapping sites of right female breast: Secondary | ICD-10-CM | POA: Diagnosis not present

## 2024-06-20 DIAGNOSIS — Z5111 Encounter for antineoplastic chemotherapy: Secondary | ICD-10-CM | POA: Diagnosis not present

## 2024-06-20 LAB — CBC WITH DIFFERENTIAL (CANCER CENTER ONLY)
Abs Immature Granulocytes: 0.17 K/uL — ABNORMAL HIGH (ref 0.00–0.07)
Basophils Absolute: 0.1 K/uL (ref 0.0–0.1)
Basophils Relative: 1 %
Eosinophils Absolute: 0.1 K/uL (ref 0.0–0.5)
Eosinophils Relative: 1 %
HCT: 32.4 % — ABNORMAL LOW (ref 36.0–46.0)
Hemoglobin: 10.9 g/dL — ABNORMAL LOW (ref 12.0–15.0)
Immature Granulocytes: 3 %
Lymphocytes Relative: 27 %
Lymphs Abs: 1.8 K/uL (ref 0.7–4.0)
MCH: 30.9 pg (ref 26.0–34.0)
MCHC: 33.6 g/dL (ref 30.0–36.0)
MCV: 91.8 fL (ref 80.0–100.0)
Monocytes Absolute: 0.9 K/uL (ref 0.1–1.0)
Monocytes Relative: 14 %
Neutro Abs: 3.5 K/uL (ref 1.7–7.7)
Neutrophils Relative %: 54 %
Platelet Count: 271 K/uL (ref 150–400)
RBC: 3.53 MIL/uL — ABNORMAL LOW (ref 3.87–5.11)
RDW: 16.6 % — ABNORMAL HIGH (ref 11.5–15.5)
WBC Count: 6.5 K/uL (ref 4.0–10.5)
nRBC: 0.3 % — ABNORMAL HIGH (ref 0.0–0.2)

## 2024-06-20 LAB — CMP (CANCER CENTER ONLY)
ALT: 8 U/L (ref 0–44)
AST: 17 U/L (ref 15–41)
Albumin: 4 g/dL (ref 3.5–5.0)
Alkaline Phosphatase: 69 U/L (ref 38–126)
Anion gap: 9 (ref 5–15)
BUN: 16 mg/dL (ref 8–23)
CO2: 25 mmol/L (ref 22–32)
Calcium: 9.2 mg/dL (ref 8.9–10.3)
Chloride: 105 mmol/L (ref 98–111)
Creatinine: 0.64 mg/dL (ref 0.44–1.00)
GFR, Estimated: >60 mL/min (ref >60)
Glucose, Bld: 167 mg/dL — ABNORMAL HIGH (ref 70–99)
Potassium: 4.2 mmol/L (ref 3.5–5.1)
Sodium: 139 mmol/L (ref 135–145)
Total Bilirubin: 0.2 mg/dL (ref 0.0–1.2)
Total Protein: 6.2 g/dL — ABNORMAL LOW (ref 6.5–8.1)

## 2024-06-20 MED ORDER — DIPHENHYDRAMINE HCL 50 MG/ML IJ SOLN
50.0000 mg | Freq: Once | INTRAMUSCULAR | Status: AC
  Administered 2024-06-20: 50 mg via INTRAVENOUS
  Filled 2024-06-20: qty 1

## 2024-06-20 MED ORDER — SODIUM CHLORIDE 0.9 % IV SOLN
65.0000 mg/m2 | Freq: Once | INTRAVENOUS | Status: AC
  Administered 2024-06-20: 114 mg via INTRAVENOUS
  Filled 2024-06-20: qty 19

## 2024-06-20 MED ORDER — SODIUM CHLORIDE 0.9 % IV SOLN
117.9000 mg | Freq: Once | INTRAVENOUS | Status: AC
  Administered 2024-06-20: 120 mg via INTRAVENOUS
  Filled 2024-06-20: qty 12

## 2024-06-20 MED ORDER — DEXAMETHASONE SOD PHOSPHATE PF 10 MG/ML IJ SOLN
10.0000 mg | Freq: Once | INTRAMUSCULAR | Status: AC
  Administered 2024-06-20: 10 mg via INTRAVENOUS

## 2024-06-20 MED ORDER — PALONOSETRON HCL INJECTION 0.25 MG/5ML
0.2500 mg | Freq: Once | INTRAVENOUS | Status: AC
  Administered 2024-06-20: 0.25 mg via INTRAVENOUS
  Filled 2024-06-20: qty 5

## 2024-06-20 MED ORDER — SODIUM CHLORIDE 0.9 % IV SOLN: INTRAVENOUS | Status: DC

## 2024-06-20 MED ORDER — SODIUM CHLORIDE 0.9 % IV SOLN
200.0000 mg | Freq: Once | INTRAVENOUS | Status: AC
  Administered 2024-06-20: 200 mg via INTRAVENOUS
  Filled 2024-06-20: qty 200

## 2024-06-20 MED ORDER — FAMOTIDINE IN NACL 20-0.9 MG/50ML-% IV SOLN
20.0000 mg | Freq: Once | INTRAVENOUS | Status: AC
  Administered 2024-06-20: 20 mg via INTRAVENOUS
  Filled 2024-06-20: qty 50

## 2024-06-20 NOTE — Research (Signed)
 [name], ICE COMPRESS: RANDOMIZED TRIAL OF LIMB CRYOCOMPRESSION VERSUS CONTINUOUS COMPRESSION VERSUS LOW CYCLIC COMPRESSION FOR THE PREVENTION OF TAXANE-INDUCED PERIPHERAL NEUROPATHY    Week 10 Patient arrives today unaccompanied for the week 10 treatment. Confirmed patient does not have wounds, sores, or lesions to extremities. Patient has not had any vaccinations since last visit.   MD/PROVIDER VISIT: Patient sees Dr.Gudena for today's visit.    ADVERSE EVENTS:   Solicited AEs reviewed with patient.  Pt stated that she is not experiencing any numbness in her fingertips today. She denies any new AE symptoms. See AE table below.      Adverse Event CTCAE Grade Onset date Resolved date Relationship to Study Intervention Action Taken Comments  Skin atrophy (solicited) 0            Skin hyperpigmentation (solicited) 0            Skin hypopigmentation (solicited) 0            Skin induration (solicited) 0            Skin ulceration (solicited) 0            Rash maculopapular (solicited) 0            Nail changes (solicited) 0            Cold intolerance (solicited) (general disorders and administration site conditions- other) 0            Frostbite (solicited) (skin and subcutaneous tissue disorders- other) 0            Nail discoloration (Non solicited)  1 05/09/24 Ongoing   None    Peripheral sensory neuropathy (Non solicited) 1 05/30/24 06/06/24 unrelated None        STUDY INTERVENTION & TOLERABILITY ASSESSMENTS: 1:26 pm am Device wraps applied; pre-treatment started set on Arm 1 CyroCompression with temperature setting at 11 degree celsius and cyclic pressure 5-15 mm Hg.  1:36 pm Required 5-15 minute Tolerability check- pt states tolerable to cyrocompression with temperature setting at 11 degree celsius and cyclic pressure 5-15 mm Hg. 7:88 pm Taxane infusion started and moved to treatment phase on Paxman machine.  Cyrocompression with temperature setting at 11 degree celsius and cyclic  pressure 5-15 mm Hg. 3:00 pm Bathroom break. Wraps removed 3:08 pm Wraps replaced and intervention restarted.  3:15 pmTaxane infusion end time. Moved to post-treatment phase. 3:53 pm Wraps removed. Intervention ended. Pt made up bathroom break (8 minutes total) during post treatment phase per protocol. Confirmed no new wounds, sores, or lesions.      The patient was thanked for their time and continued voluntary participation in this study. She understands research nurse will meet her again at next scheduled taxane infusion to apply study device. [name] has been provided direct contact information and is encouraged to contact this Nurse for any needs or question.   [name], RN, BSN Clinical Research Nurse [phone] 06/20/2024

## 2024-06-20 NOTE — Assessment & Plan Note (Signed)
 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%    CT CAP 04/12/2024: Right breast mass and prominent right axillary lymph nodes, calcified omental nodules (probably granulomatous disease) peritoneal carcinomatosis not excluded.,  Left ovary lesion 3.8 cm Bone scan 04/13/2024: 2 foci of increased uptake in the cervical spine region indeterminate recommend MRI spine Breast MRI 04/13/2024: Right breast non-mass enhancement 9 cm, 3 abnormal right axillary lymph nodes   Treatment plan: Neoadjuvant chemotherapy with Taxol  carbo Keytruda  weekly x 12 followed by Adriamycin Cytoxan Keytruda  followed by Keytruda  maintenance Breast conserving surgery with ALND Adjuvant radiation therapy   GYN evaluation for the ovarian adnexal lesion: Status post pelvic ultrasound.  Dr. 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 Return to clinic weekly for chemo

## 2024-06-20 NOTE — Progress Notes (Addendum)
 Patient Care Team: [name], MD as PCP - General (Family Medicine) Gerome, Devere HERO, RN as Oncology Nurse Navigator Tyree Nanetta SAILOR, RN as Oncology Nurse Navigator Odean Potts, MD as Consulting Physician (Hematology and Oncology) Dewey Rush, MD as Consulting Physician (Radiation Oncology) Curvin Deward MOULD, MD as Consulting Physician (General Surgery)  DIAGNOSIS:  Encounter Diagnosis  Name Primary?   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  The report date is April 19, 2024.  The CancerNext-Expanded gene panel offered by [facility] and includes sequencing, rearrangement, and RNA analysis for the following 77 genes: AIP, ALK, APC, ATM, BAP1, BARD1, BMPR1A, BRCA1, BRCA2, BRIP1, CDC73, CDH1, CDK4, CDKN1B, CDKN2A, CEBPA, CHEK2, CTNNA1, DDX41, DICER1, ETV6, FH, FLCN, GATA2, LZTR1, MAX, MBD4, MEN1, MET, MLH1, MSH2, MSH3, MSH6, MUTYH, NF1, NF2, NTHL1, PALB2, PHOX2B, PMS2, POT1, PRKAR1A, PTCH1, PTEN, RAD51C, RAD51D, RB1,  RET, RPS20, RUNX1, SDHA, SDHAF2, SDHB, SDHC, SDHD, SMAD4, SMARCA4, SMARCB1, SMARCE1, STK11, SUFU, TMEM127, TP53, TSC1, TSC2, VHL, and WT1 (sequencing and deletion/duplication); AXIN2, CTNNA1, DDX41, EGFR, HOXB13, KIT, MBD4, MITF, MSH3, PDGFRA, POLD1 and POLE (sequencing only); EPCAM and GREM1 (deletion/duplication only). RNA data is routinely analyzed for use in variant interpretation for all genes.      CHIEF COMPLIANT: Cycle 10 Taxol  carboplatin  Keytruda   HISTORY OF PRESENT ILLNESS:  History of Present Illness Christy Wiley is a 73 year old female undergoing chemotherapy who presents for follow-up regarding treatment side effects.  She experiences changes in vision, which she attributes to chemotherapy, and plans to have an eye exam after completing treatment. There is discoloration in her fingers without numbness or neuropathy. She maintains adequate hydration, drinking water comfortably. Recent lab results show a normal white blood cell count and hemoglobin levels increasing from 10.6 to 10.9. She experiences a stinging sensation with a cream, possibly due to a new soap, which resolves after wiping off the cream. She inquires about scalp care to maintain hair health during treatment, using coconut oil for her hands and feet. No new or worsening side effects, numbness, neuropathy, or nausea are present.     ALLERGIES:  is allergic to penicillin g.  MEDICATIONS:  Current Outpatient Medications  Medication Sig Dispense Refill   atorvastatin (LIPITOR) 40 MG tablet Take 40 mg by mouth.     cholecalciferol (VITAMIN D3) 25 MCG (1000 UNIT) tablet Take 1,000 Units by mouth daily.     dapagliflozin propanediol (FARXIGA) 10 MG TABS tablet Take 10 mg by mouth daily.     lidocaine -prilocaine  (EMLA ) cream Apply to affected area  once 30 g 3   lisinopril (ZESTRIL) 5 MG tablet Take 5 mg by mouth.     meloxicam (MOBIC) 7.5 MG tablet Take 7.5 mg by mouth daily as needed.     ondansetron  (ZOFRAN ) 8 MG  tablet Take 1 tablet (8 mg total) by mouth every 8 (eight) hours as needed for nausea or vomiting. Start on the third day after chemotherapy. 30 tablet 1   oxyCODONE  (ROXICODONE ) 5 MG immediate release tablet Take 1 tablet (5 mg total) by mouth every 6 (six) hours as needed. 10 tablet 0   pantoprazole  (PROTONIX ) 40 MG tablet Take 1 tablet (40 mg total) by mouth daily. 30 tablet 3   prochlorperazine  (COMPAZINE ) 10 MG tablet Take 1 tablet (10 mg total) by mouth every 6 (six) hours as needed for nausea or vomiting. 30 tablet 1   No current facility-administered medications for this visit.    PHYSICAL EXAMINATION: ECOG PERFORMANCE STATUS: 1 - Symptomatic but completely ambulatory  Vitals:   06/20/24 1158  BP: 119/71  Pulse: 93  Resp: 18  Temp: (!) 97.3 F (36.3 C)  SpO2: 100%   Filed Weights   06/20/24 1158  Weight: 144 lb 11.2 oz (65.6 kg)      LABORATORY DATA:  I have reviewed the data as listed    Latest Ref Rng & Units 06/13/2024   12:55 PM 06/06/2024    1:03 PM 05/30/2024   11:40 AM  CMP  Glucose 70 - 99 mg/dL 856  865  831   BUN 8 - 23 mg/dL 17  21  14    Creatinine 0.44 - 1.00 mg/dL 9.35  9.31  9.28   Sodium 135 - 145 mmol/L 139  137  139   Potassium 3.5 - 5.1 mmol/L 4.0  4.2  4.2   Chloride 98 - 111 mmol/L 108  108  109   CO2 22 - 32 mmol/L 25  23  26    Calcium 8.9 - 10.3 mg/dL 8.8  9.0  8.9   Total Protein 6.5 - 8.1 g/dL 6.5  6.7  6.8   Total Bilirubin 0.0 - 1.2 mg/dL 0.4  0.3  0.3   Alkaline Phos 38 - 126 U/L 47  55  70   AST 15 - 41 U/L 11  15  14    ALT 0 - 44 U/L 10  21  15      Lab Results  Component Value Date   WBC 6.5 06/20/2024   HGB 10.9 (L) 06/20/2024   HCT 32.4 (L) 06/20/2024   MCV 91.8 06/20/2024   PLT 271 06/20/2024   NEUTROABS 3.5 06/20/2024    ASSESSMENT & PLAN:  Malignant neoplasm of overlapping sites of right breast in female, estrogen receptor negative (HCC) 03/28/2024:Screening mammogram detected right breast asymmetry, 2 lesions 1.9 cm  at 10:00 and 0.7 cm at the 11:00: Biopsy: 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%    CT CAP 04/12/2024: Right breast mass and prominent right axillary lymph nodes, calcified omental nodules (probably granulomatous disease) peritoneal carcinomatosis not excluded.,  Left ovary lesion 3.8 cm Bone scan 04/13/2024: 2 foci of increased uptake in the cervical spine region indeterminate recommend MRI spine Breast MRI 04/13/2024: Right breast non-mass enhancement 9 cm, 3 abnormal right axillary lymph nodes   Treatment plan: Neoadjuvant chemotherapy with Taxol  carbo Keytruda  weekly x 12 followed by Adriamycin Cytoxan Keytruda  followed by Keytruda  maintenance Breast conserving surgery with ALND Adjuvant radiation therapy   GYN evaluation for the ovarian  adnexal lesion: Status post pelvic ultrasound.  Dr. Comer Dollar intends to do surgery in conjunction with breast surgery -------------------------------------------------------------------------------------------------------------------- Current treatment: Cycle 10 Taxol  carbo Keytruda  (started 04/19/2024) participation in S2205 clinical trial    Chemo toxicities: Fatigue: Mild Slight leukopenia: We lowered the dosage of Taxol , stable white blood cell counts Nailbed discoloration Chemotherapy-induced anemia: Today's hemoglobin is 10.9 Peripheral neuropathy: Stable    Otherwise tolerating chemo fairly well. Return to clinic weekly for chemo     No orders of the defined types were placed in this encounter.  The patient has a good understanding of the overall plan. she agrees with it. she will call with any problems that may develop before the next visit here.  I personally spent a total of 30 minutes in the care of the patient today including preparing to see the patient, getting/reviewing separately obtained history, performing a medically appropriate exam/evaluation, counseling and educating, placing orders, referring and  communicating with other health care professionals, documenting clinical information in the EHR, independently interpreting results, communicating results, and coordinating care.   Viinay K Alura Olveda, MD 06/20/24

## 2024-06-20 NOTE — Patient Instructions (Signed)
 [facility] - A DEPT OF [facility]  Discharge Instructions: Thank you for choosing [facility] to provide your oncology and hematology care.   If you have a lab appointment with the Cancer Center, please go directly to the Cancer Center and check in at the registration area.   Wear comfortable clothing and clothing appropriate for easy access to any Portacath or PICC line.   We strive to give you quality time with your provider. You may need to reschedule your appointment if you arrive late (15 or more minutes).  Arriving late affects you and other patients whose appointments are after yours.  Also, if you miss three or more appointments without notifying the office, you may be dismissed from the clinic at the provider's discretion.      For prescription refill requests, have your pharmacy contact our office and allow 72 hours for refills to be completed.    Today you received the following chemotherapy and/or immunotherapy agents: Pembrolizumab  (Keytruda ), Paclitaxel  (Taxol ), & Carboplatin  (Paraplatin )   To help prevent nausea and vomiting after your treatment, we encourage you to take your nausea medication as directed.  BELOW ARE SYMPTOMS THAT SHOULD BE REPORTED IMMEDIATELY: *FEVER GREATER THAN 100.4 F (38 C) OR HIGHER *CHILLS OR SWEATING *NAUSEA AND VOMITING THAT IS NOT CONTROLLED WITH YOUR NAUSEA MEDICATION *UNUSUAL SHORTNESS OF BREATH *UNUSUAL BRUISING OR BLEEDING *URINARY PROBLEMS (pain or burning when urinating, or frequent urination) *BOWEL PROBLEMS (unusual diarrhea, constipation, pain near the anus) TENDERNESS IN MOUTH AND THROAT WITH OR WITHOUT PRESENCE OF ULCERS (sore throat, sores in mouth, or a toothache) UNUSUAL RASH, SWELLING OR PAIN  UNUSUAL VAGINAL DISCHARGE OR ITCHING   Items with * indicate a potential emergency and should be followed up as soon as possible or go to the Emergency Department if any problems should  occur.  Please show the CHEMOTHERAPY ALERT CARD or IMMUNOTHERAPY ALERT CARD at check-in to the Emergency Department and triage nurse.  Should you have questions after your visit or need to cancel or reschedule your appointment, please contact [facility] - A DEPT OF [facility]  Dept: [phone]  and follow the prompts.  Office hours are 8:00 a.m. to 4:30 p.m. Monday - Friday. Please note that voicemails left after 4:00 p.m. may not be returned until the following business day.  We are closed weekends and major holidays. You have access to a nurse at all times for urgent questions. Please call the main number to the clinic Dept: [phone] and follow the prompts.   For any non-urgent questions, you may also contact your provider using MyChart. We now offer e-Visits for anyone 51 and older to request care online for non-urgent symptoms. For details visit [email or web address].   Also download the MyChart app! Go to the app store, search MyChart, open the app, select [place], and log in with your MyChart username and password.

## 2024-06-27 ENCOUNTER — Encounter: Payer: Self-pay | Admitting: *Deleted

## 2024-06-27 ENCOUNTER — Inpatient Hospital Stay

## 2024-06-27 ENCOUNTER — Inpatient Hospital Stay (HOSPITAL_BASED_OUTPATIENT_CLINIC_OR_DEPARTMENT_OTHER): Admitting: Adult Health

## 2024-06-27 ENCOUNTER — Encounter: Payer: Self-pay | Admitting: Adult Health

## 2024-06-27 ENCOUNTER — Encounter: Payer: Self-pay | Admitting: Hematology and Oncology

## 2024-06-27 VITALS — BP 129/77 | HR 81

## 2024-06-27 VITALS — BP 116/69 | HR 82 | Temp 97.3°F | Resp 16 | Ht 63.0 in | Wt 144.0 lb

## 2024-06-27 DIAGNOSIS — Z5111 Encounter for antineoplastic chemotherapy: Secondary | ICD-10-CM | POA: Diagnosis not present

## 2024-06-27 DIAGNOSIS — C50811 Malignant neoplasm of overlapping sites of right female breast: Secondary | ICD-10-CM

## 2024-06-27 DIAGNOSIS — Z171 Estrogen receptor negative status [ER-]: Secondary | ICD-10-CM | POA: Diagnosis not present

## 2024-06-27 LAB — CMP (CANCER CENTER ONLY)
ALT: 14 U/L (ref 0–44)
AST: 17 U/L (ref 15–41)
Albumin: 4.1 g/dL (ref 3.5–5.0)
Alkaline Phosphatase: 47 U/L (ref 38–126)
Anion gap: 8 (ref 5–15)
BUN: 16 mg/dL (ref 8–23)
CO2: 23 mmol/L (ref 22–32)
Calcium: 8.9 mg/dL (ref 8.9–10.3)
Chloride: 106 mmol/L (ref 98–111)
Creatinine: 0.68 mg/dL (ref 0.44–1.00)
GFR, Estimated: >60 mL/min (ref >60)
Glucose, Bld: 189 mg/dL — ABNORMAL HIGH (ref 70–99)
Potassium: 3.9 mmol/L (ref 3.5–5.1)
Sodium: 138 mmol/L (ref 135–145)
Total Bilirubin: 0.3 mg/dL (ref 0.0–1.2)
Total Protein: 6.5 g/dL (ref 6.5–8.1)

## 2024-06-27 LAB — CBC WITH DIFFERENTIAL (CANCER CENTER ONLY)
Abs Immature Granulocytes: 0.02 K/uL (ref 0.00–0.07)
Basophils Absolute: 0.1 K/uL (ref 0.0–0.1)
Basophils Relative: 1 %
Eosinophils Absolute: 0.1 K/uL (ref 0.0–0.5)
Eosinophils Relative: 2 %
HCT: 31.9 % — ABNORMAL LOW (ref 36.0–46.0)
Hemoglobin: 10.6 g/dL — ABNORMAL LOW (ref 12.0–15.0)
Immature Granulocytes: 0 %
Lymphocytes Relative: 27 %
Lymphs Abs: 1.4 K/uL (ref 0.7–4.0)
MCH: 31 pg (ref 26.0–34.0)
MCHC: 33.2 g/dL (ref 30.0–36.0)
MCV: 93.3 fL (ref 80.0–100.0)
Monocytes Absolute: 0.3 K/uL (ref 0.1–1.0)
Monocytes Relative: 6 %
Neutro Abs: 3.3 K/uL (ref 1.7–7.7)
Neutrophils Relative %: 64 %
Platelet Count: 263 K/uL (ref 150–400)
RBC: 3.42 MIL/uL — ABNORMAL LOW (ref 3.87–5.11)
RDW: 16.2 % — ABNORMAL HIGH (ref 11.5–15.5)
WBC Count: 5.2 K/uL (ref 4.0–10.5)
nRBC: 0 % (ref 0.0–0.2)

## 2024-06-27 MED ORDER — DEXAMETHASONE SOD PHOSPHATE PF 10 MG/ML IJ SOLN
10.0000 mg | Freq: Once | INTRAMUSCULAR | Status: AC
  Administered 2024-06-27: 10 mg via INTRAVENOUS

## 2024-06-27 MED ORDER — PALONOSETRON HCL INJECTION 0.25 MG/5ML
0.2500 mg | Freq: Once | INTRAVENOUS | Status: AC
  Administered 2024-06-27: 0.25 mg via INTRAVENOUS
  Filled 2024-06-27: qty 5

## 2024-06-27 MED ORDER — SODIUM CHLORIDE 0.9 % IV SOLN
65.0000 mg/m2 | Freq: Once | INTRAVENOUS | Status: AC
  Administered 2024-06-27: 114 mg via INTRAVENOUS
  Filled 2024-06-27: qty 19

## 2024-06-27 MED ORDER — FAMOTIDINE IN NACL 20-0.9 MG/50ML-% IV SOLN
20.0000 mg | Freq: Once | INTRAVENOUS | Status: AC
  Administered 2024-06-27: 20 mg via INTRAVENOUS
  Filled 2024-06-27: qty 50

## 2024-06-27 MED ORDER — DIPHENHYDRAMINE HCL 50 MG/ML IJ SOLN
50.0000 mg | Freq: Once | INTRAMUSCULAR | Status: AC
  Administered 2024-06-27: 50 mg via INTRAVENOUS
  Filled 2024-06-27: qty 1

## 2024-06-27 MED ORDER — SODIUM CHLORIDE 0.9 % IV SOLN
117.9000 mg | Freq: Once | INTRAVENOUS | Status: AC
  Administered 2024-06-27: 120 mg via INTRAVENOUS
  Filled 2024-06-27: qty 12

## 2024-06-27 MED ORDER — SODIUM CHLORIDE 0.9 % IV SOLN: INTRAVENOUS | Status: DC

## 2024-06-27 NOTE — Progress Notes (Signed)
 [facility] Cancer Follow up:    Christy Francisco, MD [address] [place] [address]   DIAGNOSIS:  Cancer Staging  Malignant neoplasm of overlapping sites of right breast in female, estrogen receptor negative (HCC) Staging form: Breast, AJCC 8th Edition - Clinical: Stage IIB (cT1c, cN1, cM0, G3, ER-, PR-, HER2-) - Signed by Odean Potts, MD on 04/05/2024 Stage prefix: Initial diagnosis Histologic grading system: 3 grade system  SUMMARY OF ONCOLOGIC HISTORY: Oncology History  Malignant neoplasm of overlapping sites of right breast in female, estrogen receptor negative (HCC)  03/28/2024 Initial Diagnosis   Screening mammogram detected right breast asymmetry, 2 lesions 1.9 cm at 10:00 and 0.7 cm at the 11:00: Biopsy: 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  The report date is April 19, 2024.  The CancerNext-Expanded gene panel offered by [facility] and includes sequencing, rearrangement, and RNA analysis for the following 77 genes: AIP, ALK, APC, ATM, BAP1, BARD1, BMPR1A, BRCA1, BRCA2, BRIP1, CDC73, CDH1, CDK4, CDKN1B, CDKN2A, CEBPA, CHEK2, CTNNA1, DDX41, DICER1, ETV6, FH, FLCN, GATA2, LZTR1, MAX, MBD4, MEN1, MET, MLH1, MSH2, MSH3, MSH6, MUTYH, NF1, NF2, NTHL1, PALB2, PHOX2B, PMS2, POT1, PRKAR1A, PTCH1, PTEN, RAD51C, RAD51D, RB1, RET, RPS20, RUNX1, SDHA, SDHAF2, SDHB, SDHC, SDHD, SMAD4, SMARCA4,  SMARCB1, SMARCE1, STK11, SUFU, TMEM127, TP53, TSC1, TSC2, VHL, and WT1 (sequencing and deletion/duplication); AXIN2, CTNNA1, DDX41, EGFR, HOXB13, KIT, MBD4, MITF, MSH3, PDGFRA, POLD1 and POLE (sequencing only); EPCAM and GREM1 (deletion/duplication only). RNA data is routinely analyzed for use in variant interpretation for all genes.      CURRENT THERAPY: taxol  carbo  INTERVAL HISTORY:  Patient is here for taxol  carbo.  She is tolerating treatment moderately well and is ready to proceed with it today.  She denies peripheral neuropathy.    Patient Active Problem List   Diagnosis Date Noted   Genetic testing 04/24/2024   Family history of prostate cancer    Family history of ovarian cancer    Malignant neoplasm of overlapping sites of right breast in female, estrogen receptor negative (HCC) 04/04/2024    is allergic to penicillin g.  MEDICAL HISTORY: Past Medical History:  Diagnosis Date   Anemia    Arthritis    Blood transfusion without reported diagnosis    Cancer (HCC)    Diabetes (HCC)    Family history of ovarian cancer    Family history of prostate cancer    Fibroid    Vaginal Pap smear, abnormal     SURGICAL HISTORY: Past Surgical History:  Procedure Laterality Date   ABDOMINAL HYSTERECTOMY     fibroids, left adnexa   BREAST BIOPSY Right 03/28/2024   US  RT BREAST BX W LOC DEV 1ST LESION IMG BX SPEC US  GUIDE 03/28/2024 [facility]   BREAST BIOPSY Right 03/28/2024   US  RT BREAST BX W LOC DEV EA ADD LESION IMG BX SPEC US  GUIDE 03/28/2024 [facility]  PORTACATH PLACEMENT Left 04/14/2024   Procedure: INSERTION, TUNNELED CENTRAL VENOUS DEVICE, WITH PORT;  Surgeon: Curvin Deward MOULD, MD;  Location: [facility];  Service: General;  Laterality: Left;  PORT PLACEMENT WITH ULTRASOUND GUIDANCE    SOCIAL HISTORY: Social History   Socioeconomic History   Marital status: Single    Spouse name: Not on file   Number of children: 0   Years of  education: Not on file   Highest education level: Not on file  Occupational History   Not on file  Tobacco Use   Smoking status: Former    Current packs/day: 0.00    Types: Cigarettes    Quit date: 01/1996    Years since quitting: 28.5   Smokeless tobacco: Never  Vaping Use   Vaping status: Never Used  Substance and Sexual Activity   Alcohol  use: Never   Drug use: Never   Sexual activity: Not Currently  Other Topics Concern   Not on file  Social History Narrative   Not on file   Social Drivers of Health   Financial Resource Strain: Low Risk  (06/27/2024)   Overall Financial Resource Strain (CARDIA)    Difficulty of Paying Living Expenses: Not hard at all  Food Insecurity: No Food Insecurity (06/27/2024)   Hunger Vital Sign    Worried About Running Out of Food in the Last Year: Never true    Ran Out of Food in the Last Year: Never true  Transportation Needs: No Transportation Needs (06/27/2024)   PRAPARE - Administrator, Civil Service (Medical): No    Lack of Transportation (Non-Medical): No  Physical Activity: Not on file  Stress: No Stress Concern Present (06/27/2024)   [employer] of Occupational Health - Occupational Stress Questionnaire    Feeling of Stress: Not at all  Social Connections: Unknown (06/27/2024)   Social Connection and Isolation Panel    Frequency of Communication with Friends and Family: Twice a week    Frequency of Social Gatherings with Friends and Family: Once a week    Attends Religious Services: Not on Insurance Claims Handler of Clubs or Organizations: No    Attends Banker Meetings: Never    Marital Status: Not on file  Intimate Partner Violence: Not At Risk (06/27/2024)   Humiliation, Afraid, Rape, and Kick questionnaire    Fear of Current or Ex-Partner: No    Emotionally Abused: No    Physically Abused: No    Sexually Abused: No    FAMILY HISTORY: Family History  Problem Relation Age of Onset   Cancer  Brother    Prostate cancer Brother    Ovarian cancer Maternal Aunt    Cervical cancer Maternal Aunt    Breast cancer Neg Hx     Review of Systems  Constitutional:  Negative for appetite change, chills, fatigue, fever and unexpected weight change.  HENT:   Negative for hearing loss, lump/mass and trouble swallowing.   Eyes:  Negative for eye problems and icterus.  Respiratory:  Negative for chest tightness, cough and shortness of breath.   Cardiovascular:  Negative for chest pain, leg swelling and palpitations.  Gastrointestinal:  Negative for abdominal distention, abdominal pain, constipation, diarrhea, nausea and vomiting.  Endocrine: Negative for hot flashes.  Genitourinary:  Negative for difficulty urinating.   Musculoskeletal:  Negative for arthralgias.  Skin:  Negative for itching and rash.  Neurological:  Negative for dizziness, extremity weakness, headaches and numbness.  Hematological:  Negative  for adenopathy. Does not bruise/bleed easily.  Psychiatric/Behavioral:  Negative for depression. The patient is not nervous/anxious.       PHYSICAL EXAMINATION    Vitals:   06/27/24 1233  BP: 116/69  Pulse: 82  Resp: 16  Temp: (!) 97.3 F (36.3 C)  SpO2: 100%    Physical Exam Constitutional:      General: She is not in acute distress.    Appearance: Normal appearance. She is not toxic-appearing.  HENT:     Head: Normocephalic and atraumatic.     Mouth/Throat:     Mouth: Mucous membranes are moist.     Pharynx: Oropharynx is clear. No oropharyngeal exudate or posterior oropharyngeal erythema.  Eyes:     General: No scleral icterus. Cardiovascular:     Rate and Rhythm: Normal rate and regular rhythm.     Pulses: Normal pulses.     Heart sounds: Normal heart sounds.  Pulmonary:     Effort: Pulmonary effort is normal.     Breath sounds: Normal breath sounds.  Abdominal:     General: Abdomen is flat. Bowel sounds are normal. There is no distension.     Palpations:  Abdomen is soft.     Tenderness: There is no abdominal tenderness.  Musculoskeletal:        General: No swelling.     Cervical back: Neck supple.  Lymphadenopathy:     Cervical: No cervical adenopathy.  Skin:    General: Skin is warm and dry.     Findings: No rash.  Neurological:     General: No focal deficit present.     Mental Status: She is alert.  Psychiatric:        Mood and Affect: Mood normal.        Behavior: Behavior normal.     LABORATORY DATA:  CBC    Component Value Date/Time   WBC 5.2 06/27/2024 1213   RBC 3.42 (L) 06/27/2024 1213   HGB 10.6 (L) 06/27/2024 1213   HCT 31.9 (L) 06/27/2024 1213   PLT 263 06/27/2024 1213   MCV 93.3 06/27/2024 1213   MCH 31.0 06/27/2024 1213   MCHC 33.2 06/27/2024 1213   RDW 16.2 (H) 06/27/2024 1213   LYMPHSABS 1.4 06/27/2024 1213   MONOABS 0.3 06/27/2024 1213   EOSABS 0.1 06/27/2024 1213   BASOSABS 0.1 06/27/2024 1213    CMP     Component Value Date/Time   NA 138 06/27/2024 1213   K 3.9 06/27/2024 1213   CL 106 06/27/2024 1213   CO2 23 06/27/2024 1213   GLUCOSE 189 (H) 06/27/2024 1213   BUN 16 06/27/2024 1213   CREATININE 0.68 06/27/2024 1213   CALCIUM 8.9 06/27/2024 1213   PROT 6.5 06/27/2024 1213   ALBUMIN 4.1 06/27/2024 1213   AST 17 06/27/2024 1213   ALT 14 06/27/2024 1213   ALKPHOS 47 06/27/2024 1213   BILITOT 0.3 06/27/2024 1213   GFRNONAA >60 06/27/2024 1213     ASSESSMENT and THERAPY PLAN:   Malignant neoplasm of overlapping sites of right breast in female, estrogen receptor negative (HCC) 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%    CT CAP 04/12/2024: Right breast mass and prominent right axillary lymph nodes, calcified omental nodules (probably granulomatous disease) peritoneal carcinomatosis not excluded.,  Left ovary lesion 3.8 cm Bone scan 04/13/2024: 2 foci of  increased uptake in the  cervical spine region indeterminate recommend MRI spine Breast MRI 04/13/2024: Right breast non-mass enhancement 9 cm, 3 abnormal right axillary lymph nodes   Treatment plan: Neoadjuvant chemotherapy with Taxol  carbo Keytruda  weekly x 12 followed by Adriamycin Cytoxan Keytruda  followed by Keytruda  maintenance Breast conserving surgery with ALND Adjuvant radiation therapy   GYN evaluation for the ovarian adnexal lesion: Status post pelvic ultrasound.  Dr. Comer Dollar intends to do surgery in conjunction with breast surgery -------------------------------------------------------------------------------------------------------------------- Current treatment:  Taxol  carbo Keytruda  (started 04/19/2024) participation in S2205 clinical trial   Proceed with treatment today, tolerating well with no new concerns.    RTC weekly for labs, f/u and treatment.   All questions were answered. The patient knows to call the clinic with any problems, questions or concerns. We can certainly see the patient much sooner if necessary.  Total encounter time:20 minutes*in face-to-face visit time, chart review, lab review, care coordination, order entry, and documentation of the encounter time.    [name], NP 06/30/24 3:09 PM Medical Oncology and Hematology [facility] [address] [place], [place] [address] Tel. [phone]    Fax. [phone]  *Total Encounter Time as defined by the Centers for Medicare and Medicaid Services includes, in addition to the face-to-face time of a patient visit (documented in the note above) non-face-to-face time: obtaining and reviewing outside history, ordering and reviewing medications, tests or procedures, care coordination (communications with other health care professionals or caregivers) and documentation in the medical record.

## 2024-06-27 NOTE — Patient Instructions (Signed)
 [facility] - A DEPT OF [facility]  Discharge Instructions: Thank you for choosing [facility] to provide your oncology and hematology care.   If you have a lab appointment with the Cancer Center, please go directly to the Cancer Center and check in at the registration area.   Wear comfortable clothing and clothing appropriate for easy access to any Portacath or PICC line.   We strive to give you quality time with your provider. You may need to reschedule your appointment if you arrive late (15 or more minutes).  Arriving late affects you and other patients whose appointments are after yours.  Also, if you miss three or more appointments without notifying the office, you may be dismissed from the clinic at the provider's discretion.      For prescription refill requests, have your pharmacy contact our office and allow 72 hours for refills to be completed.    Today you received the following chemotherapy and/or immunotherapy agents: Paclitaxel , Carboplatin .       To help prevent nausea and vomiting after your treatment, we encourage you to take your nausea medication as directed.  BELOW ARE SYMPTOMS THAT SHOULD BE REPORTED IMMEDIATELY: *FEVER GREATER THAN 100.4 F (38 C) OR HIGHER *CHILLS OR SWEATING *NAUSEA AND VOMITING THAT IS NOT CONTROLLED WITH YOUR NAUSEA MEDICATION *UNUSUAL SHORTNESS OF BREATH *UNUSUAL BRUISING OR BLEEDING *URINARY PROBLEMS (pain or burning when urinating, or frequent urination) *BOWEL PROBLEMS (unusual diarrhea, constipation, pain near the anus) TENDERNESS IN MOUTH AND THROAT WITH OR WITHOUT PRESENCE OF ULCERS (sore throat, sores in mouth, or a toothache) UNUSUAL RASH, SWELLING OR PAIN  UNUSUAL VAGINAL DISCHARGE OR ITCHING   Items with * indicate a potential emergency and should be followed up as soon as possible or go to the Emergency Department if any problems should occur.  Please show the CHEMOTHERAPY ALERT CARD  or IMMUNOTHERAPY ALERT CARD at check-in to the Emergency Department and triage nurse.  Should you have questions after your visit or need to cancel or reschedule your appointment, please contact [facility] - A DEPT OF [facility]  Dept: [phone]  and follow the prompts.  Office hours are 8:00 a.m. to 4:30 p.m. Monday - Friday. Please note that voicemails left after 4:00 p.m. may not be returned until the following business day.  We are closed weekends and major holidays. You have access to a nurse at all times for urgent questions. Please call the main number to the clinic Dept: [phone] and follow the prompts.   For any non-urgent questions, you may also contact your provider using MyChart. We now offer e-Visits for anyone 64 and older to request care online for non-urgent symptoms. For details visit [email or web address].   Also download the MyChart app! Go to the app store, search MyChart, open the app, select [employer], and log in with your MyChart username and password.

## 2024-06-27 NOTE — Research (Signed)
 [name], ICE COMPRESS: RANDOMIZED TRIAL OF LIMB CRYOCOMPRESSION VERSUS CONTINUOUS COMPRESSION VERSUS LOW CYCLIC COMPRESSION FOR THE PREVENTION OF TAXANE-INDUCED PERIPHERAL NEUROPATHY    Week 11 Patient arrives today unaccompanied for the week 11 treatment. Confirmed patient does not have wounds, sores, or lesions to extremities. Patient has not had any vaccinations since last visit.   MD/PROVIDER VISIT: Patient see [name] for today's visit.    ADVERSE EVENTS:   Solicited AEs reviewed with patient.  Pt stated that she is not experiencing any numbness in her fingertips today. She denies any new AE symptoms. See AE table below.      Adverse Event CTCAE Grade Onset date Resolved date Relationship to Study Intervention Action Taken Comments  Skin atrophy (solicited) 0            Skin hyperpigmentation (solicited) 0            Skin hypopigmentation (solicited) 0            Skin induration (solicited) 0            Skin ulceration (solicited) 0            Rash maculopapular (solicited) 0            Nail changes (solicited) 0            Cold intolerance (solicited) (general disorders and administration site conditions- other) 0            Frostbite (solicited) (skin and subcutaneous tissue disorders- other) 0            Nail discoloration (Non solicited)  1 05/09/24 Ongoing   None    Peripheral sensory neuropathy (Non solicited) 1 05/30/24 06/06/24 unrelated None        STUDY INTERVENTION & TOLERABILITY ASSESSMENTS: 2:26 pm am Device wraps applied; pre-treatment started set on Arm 1 CyroCompression with temperature setting at 11 degree celsius and cyclic pressure 5-15 mm Hg.  2:35 pm Required 5-15 minute Tolerability check- pt states tolerable to cyrocompression with temperature setting at 11 degree celsius and cyclic pressure 5-15 mm Hg. 6:92 pm Taxane infusion started and moved to treatment phase on Paxman machine.  Cyrocompression with temperature setting at 11 degree celsius and cyclic  pressure 5-15 mm Hg. 4:11 pmTaxane infusion end time. Moved to post-treatment phase. 4:41 pm Wraps removed. Intervention ended. Pt made up bathroom break (8 minutes total) during post treatment phase per protocol. Confirmed no new wounds, sores, or lesions.      The patient was thanked for their time and continued voluntary participation in this study. She understands research nurse will meet her again at next scheduled taxane infusion to apply study device. [name] has been provided direct contact information and is encouraged to contact this Nurse for any needs or question.   [name], RN, BSN Clinical Research Nurse [phone] 06/27/2024

## 2024-06-30 ENCOUNTER — Encounter: Payer: Self-pay | Admitting: Hematology and Oncology

## 2024-06-30 NOTE — Assessment & Plan Note (Signed)
 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%    CT CAP 04/12/2024: Right breast mass and prominent right axillary lymph nodes, calcified omental nodules (probably granulomatous disease) peritoneal carcinomatosis not excluded.,  Left ovary lesion 3.8 cm Bone scan 04/13/2024: 2 foci of increased uptake in the cervical spine region indeterminate recommend MRI spine Breast MRI 04/13/2024: Right breast non-mass enhancement 9 cm, 3 abnormal right axillary lymph nodes   Treatment plan: Neoadjuvant chemotherapy with Taxol  carbo Keytruda  weekly x 12 followed by Adriamycin Cytoxan Keytruda  followed by Keytruda  maintenance Breast conserving surgery with ALND Adjuvant radiation therapy   GYN evaluation for the ovarian adnexal lesion: Status post pelvic ultrasound.  Dr. Comer Dollar intends to do surgery in conjunction with breast surgery -------------------------------------------------------------------------------------------------------------------- Current treatment:  Taxol  carbo Keytruda  (started 04/19/2024) participation in S2205 clinical trial   Proceed with treatment today, tolerating well with no new concerns.    RTC weekly for labs, f/u and treatment.

## 2024-07-03 ENCOUNTER — Encounter: Payer: Self-pay | Admitting: General Practice

## 2024-07-03 ENCOUNTER — Encounter: Payer: Self-pay | Admitting: Hematology and Oncology

## 2024-07-03 ENCOUNTER — Telehealth: Payer: Self-pay | Admitting: *Deleted

## 2024-07-03 NOTE — Telephone Encounter (Signed)
 Attempted to reach pt this morning regarding coming to her appointment about 15 mins early tomorrow 07-04-24 to do neuro testing for the S2205 clinicial research study. LVM for return call.   Christy Larsen, RN, BSN Clinical Research Nurse [phone] 07/03/2024

## 2024-07-03 NOTE — Progress Notes (Signed)
 [facility] Spiritual Care Note  Attempted follow-up phone call, leaving voicemail of encouragement and support, including direct number to return call.  [address] [place], [place], [facility] Pager [phone] Voicemail [phone]

## 2024-07-04 ENCOUNTER — Inpatient Hospital Stay: Attending: Hematology and Oncology

## 2024-07-04 ENCOUNTER — Encounter: Payer: Self-pay | Admitting: Adult Health

## 2024-07-04 ENCOUNTER — Encounter: Payer: Self-pay | Admitting: *Deleted

## 2024-07-04 ENCOUNTER — Encounter: Payer: Self-pay | Admitting: Hematology and Oncology

## 2024-07-04 ENCOUNTER — Inpatient Hospital Stay: Admitting: Adult Health

## 2024-07-04 ENCOUNTER — Inpatient Hospital Stay

## 2024-07-04 VITALS — BP 126/74 | HR 85 | Temp 97.3°F | Resp 18 | Ht 63.0 in | Wt 144.2 lb

## 2024-07-04 DIAGNOSIS — C50811 Malignant neoplasm of overlapping sites of right female breast: Secondary | ICD-10-CM

## 2024-07-04 DIAGNOSIS — D0511 Intraductal carcinoma in situ of right breast: Secondary | ICD-10-CM | POA: Insufficient documentation

## 2024-07-04 DIAGNOSIS — Z79899 Other long term (current) drug therapy: Secondary | ICD-10-CM | POA: Diagnosis not present

## 2024-07-04 DIAGNOSIS — Z171 Estrogen receptor negative status [ER-]: Secondary | ICD-10-CM | POA: Insufficient documentation

## 2024-07-04 DIAGNOSIS — Z5111 Encounter for antineoplastic chemotherapy: Secondary | ICD-10-CM | POA: Insufficient documentation

## 2024-07-04 DIAGNOSIS — D6481 Anemia due to antineoplastic chemotherapy: Secondary | ICD-10-CM | POA: Diagnosis not present

## 2024-07-04 DIAGNOSIS — T451X5A Adverse effect of antineoplastic and immunosuppressive drugs, initial encounter: Secondary | ICD-10-CM | POA: Insufficient documentation

## 2024-07-04 DIAGNOSIS — E1140 Type 2 diabetes mellitus with diabetic neuropathy, unspecified: Secondary | ICD-10-CM | POA: Insufficient documentation

## 2024-07-04 LAB — CMP (CANCER CENTER ONLY)
ALT: 11 U/L (ref 0–44)
AST: 15 U/L (ref 15–41)
Albumin: 3.9 g/dL (ref 3.5–5.0)
Alkaline Phosphatase: 47 U/L (ref 38–126)
Anion gap: 9 (ref 5–15)
BUN: 14 mg/dL (ref 8–23)
CO2: 22 mmol/L (ref 22–32)
Calcium: 8.6 mg/dL — ABNORMAL LOW (ref 8.9–10.3)
Chloride: 108 mmol/L (ref 98–111)
Creatinine: 0.6 mg/dL (ref 0.44–1.00)
GFR, Estimated: >60 mL/min (ref >60)
Glucose, Bld: 145 mg/dL — ABNORMAL HIGH (ref 70–99)
Potassium: 4.1 mmol/L (ref 3.5–5.1)
Sodium: 139 mmol/L (ref 135–145)
Total Bilirubin: 0.2 mg/dL (ref 0.0–1.2)
Total Protein: 6.3 g/dL — ABNORMAL LOW (ref 6.5–8.1)

## 2024-07-04 LAB — CBC WITH DIFFERENTIAL (CANCER CENTER ONLY)
Abs Immature Granulocytes: 0.02 K/uL (ref 0.00–0.07)
Basophils Absolute: 0 K/uL (ref 0.0–0.1)
Basophils Relative: 1 %
Eosinophils Absolute: 0.1 K/uL (ref 0.0–0.5)
Eosinophils Relative: 2 %
HCT: 31.8 % — ABNORMAL LOW (ref 36.0–46.0)
Hemoglobin: 10.6 g/dL — ABNORMAL LOW (ref 12.0–15.0)
Immature Granulocytes: 0 %
Lymphocytes Relative: 26 %
Lymphs Abs: 1.2 K/uL (ref 0.7–4.0)
MCH: 31.5 pg (ref 26.0–34.0)
MCHC: 33.3 g/dL (ref 30.0–36.0)
MCV: 94.6 fL (ref 80.0–100.0)
Monocytes Absolute: 0.3 K/uL (ref 0.1–1.0)
Monocytes Relative: 7 %
Neutro Abs: 3.1 K/uL (ref 1.7–7.7)
Neutrophils Relative %: 64 %
Platelet Count: 332 K/uL (ref 150–400)
RBC: 3.36 MIL/uL — ABNORMAL LOW (ref 3.87–5.11)
RDW: 16.4 % — ABNORMAL HIGH (ref 11.5–15.5)
WBC Count: 4.8 K/uL (ref 4.0–10.5)
nRBC: 0 % (ref 0.0–0.2)

## 2024-07-04 LAB — RESEARCH LABS

## 2024-07-04 MED ORDER — SODIUM CHLORIDE 0.9 % IV SOLN: INTRAVENOUS | Status: DC

## 2024-07-04 MED ORDER — FAMOTIDINE IN NACL 20-0.9 MG/50ML-% IV SOLN
20.0000 mg | Freq: Once | INTRAVENOUS | Status: AC
  Administered 2024-07-04: 20 mg via INTRAVENOUS
  Filled 2024-07-04: qty 50

## 2024-07-04 MED ORDER — SODIUM CHLORIDE 0.9 % IV SOLN
117.9000 mg | Freq: Once | INTRAVENOUS | Status: AC
  Administered 2024-07-04: 120 mg via INTRAVENOUS
  Filled 2024-07-04: qty 12

## 2024-07-04 MED ORDER — DEXAMETHASONE 4 MG PO TABS: ORAL_TABLET | ORAL | 0 refills | Status: AC

## 2024-07-04 MED ORDER — SODIUM CHLORIDE 0.9 % IV SOLN
65.0000 mg/m2 | Freq: Once | INTRAVENOUS | Status: AC
  Administered 2024-07-04: 114 mg via INTRAVENOUS
  Filled 2024-07-04: qty 19

## 2024-07-04 MED ORDER — DEXAMETHASONE SOD PHOSPHATE PF 10 MG/ML IJ SOLN
10.0000 mg | Freq: Once | INTRAMUSCULAR | Status: AC
  Administered 2024-07-04: 10 mg via INTRAVENOUS

## 2024-07-04 MED ORDER — DIPHENHYDRAMINE HCL 50 MG/ML IJ SOLN
50.0000 mg | Freq: Once | INTRAMUSCULAR | Status: AC
  Administered 2024-07-04: 50 mg via INTRAVENOUS
  Filled 2024-07-04: qty 1

## 2024-07-04 MED ORDER — PALONOSETRON HCL INJECTION 0.25 MG/5ML
0.2500 mg | Freq: Once | INTRAVENOUS | Status: AC
  Administered 2024-07-04: 0.25 mg via INTRAVENOUS
  Filled 2024-07-04: qty 5

## 2024-07-04 MED ORDER — FLUCONAZOLE 200 MG PO TABS: 200.0000 mg | ORAL_TABLET | Freq: Every day | ORAL | 1 refills | Status: AC

## 2024-07-04 NOTE — Patient Instructions (Signed)
 [facility] - A DEPT OF [facility]  Discharge Instructions: Thank you for choosing [facility] to provide your oncology and hematology care.   If you have a lab appointment with the Cancer Center, please go directly to the Cancer Center and check in at the registration area.   Wear comfortable clothing and clothing appropriate for easy access to any Portacath or PICC line.   We strive to give you quality time with your provider. You may need to reschedule your appointment if you arrive late (15 or more minutes).  Arriving late affects you and other patients whose appointments are after yours.  Also, if you miss three or more appointments without notifying the office, you may be dismissed from the clinic at the provider's discretion.      For prescription refill requests, have your pharmacy contact our office and allow 72 hours for refills to be completed.    Today you received the following chemotherapy and/or immunotherapy agents: Paclitaxel , Carboplatin .       To help prevent nausea and vomiting after your treatment, we encourage you to take your nausea medication as directed.  BELOW ARE SYMPTOMS THAT SHOULD BE REPORTED IMMEDIATELY: *FEVER GREATER THAN 100.4 F (38 C) OR HIGHER *CHILLS OR SWEATING *NAUSEA AND VOMITING THAT IS NOT CONTROLLED WITH YOUR NAUSEA MEDICATION *UNUSUAL SHORTNESS OF BREATH *UNUSUAL BRUISING OR BLEEDING *URINARY PROBLEMS (pain or burning when urinating, or frequent urination) *BOWEL PROBLEMS (unusual diarrhea, constipation, pain near the anus) TENDERNESS IN MOUTH AND THROAT WITH OR WITHOUT PRESENCE OF ULCERS (sore throat, sores in mouth, or a toothache) UNUSUAL RASH, SWELLING OR PAIN  UNUSUAL VAGINAL DISCHARGE OR ITCHING   Items with * indicate a potential emergency and should be followed up as soon as possible or go to the Emergency Department if any problems should occur.  Please show the CHEMOTHERAPY ALERT CARD  or IMMUNOTHERAPY ALERT CARD at check-in to the Emergency Department and triage nurse.  Should you have questions after your visit or need to cancel or reschedule your appointment, please contact [facility] - A DEPT OF [facility]  Dept: [phone]  and follow the prompts.  Office hours are 8:00 a.m. to 4:30 p.m. Monday - Friday. Please note that voicemails left after 4:00 p.m. may not be returned until the following business day.  We are closed weekends and major holidays. You have access to a nurse at all times for urgent questions. Please call the main number to the clinic Dept: [phone] and follow the prompts.   For any non-urgent questions, you may also contact your provider using MyChart. We now offer e-Visits for anyone 64 and older to request care online for non-urgent symptoms. For details visit [email or web address].   Also download the MyChart app! Go to the app store, search MyChart, open the app, select [employer], and log in with your MyChart username and password.

## 2024-07-04 NOTE — Progress Notes (Unsigned)
 [facility] Cancer Follow up:    Christy Francisco, MD [address] [place] [address]   DIAGNOSIS: Cancer Staging  Malignant neoplasm of overlapping sites of right breast in female, estrogen receptor negative (HCC) Staging form: Breast, AJCC 8th Edition - Clinical: Stage IIB (cT1c, cN1, cM0, G3, ER-, PR-, HER2-) - Signed by Odean Potts, MD on 04/05/2024 Stage prefix: Initial diagnosis Histologic grading system: 3 grade system    SUMMARY OF ONCOLOGIC HISTORY: Oncology History  Malignant neoplasm of overlapping sites of right breast in female, estrogen receptor negative (HCC)  03/28/2024 Initial Diagnosis   Screening mammogram detected right breast asymmetry, 2 lesions 1.9 cm at 10:00 and 0.7 cm at the 11:00: Biopsy: 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  The report date is April 19, 2024.  The CancerNext-Expanded gene panel offered by [facility] and includes sequencing, rearrangement, and RNA analysis for the following 77 genes: AIP, ALK, APC, ATM, BAP1, BARD1, BMPR1A, BRCA1, BRCA2, BRIP1, CDC73, CDH1, CDK4, CDKN1B, CDKN2A, CEBPA, CHEK2, CTNNA1, DDX41, DICER1, ETV6, FH, FLCN, GATA2, LZTR1, MAX, MBD4, MEN1, MET, MLH1, MSH2, MSH3, MSH6, MUTYH, NF1, NF2, NTHL1, PALB2, PHOX2B, PMS2, POT1, PRKAR1A, PTCH1, PTEN, RAD51C, RAD51D, RB1, RET, RPS20, RUNX1, SDHA, SDHAF2, SDHB, SDHC, SDHD, SMAD4, SMARCA4,  SMARCB1, SMARCE1, STK11, SUFU, TMEM127, TP53, TSC1, TSC2, VHL, and WT1 (sequencing and deletion/duplication); AXIN2, CTNNA1, DDX41, EGFR, HOXB13, KIT, MBD4, MITF, MSH3, PDGFRA, POLD1 and POLE (sequencing only); EPCAM and GREM1 (deletion/duplication only). RNA data is routinely analyzed for use in variant interpretation for all genes.      CURRENT THERAPY: taxol  carbo weekly, keytruda  every three weeks  INTERVAL HISTORY:  Discussed the use of AI scribe software for clinical note transcription with the patient, who gave verbal consent to proceed.  History of Present Illness      Patient Active Problem List   Diagnosis Date Noted   Genetic testing 04/24/2024   Family history of prostate cancer    Family history of ovarian cancer    Malignant neoplasm of overlapping sites of right breast in female, estrogen receptor negative (HCC) 04/04/2024    is allergic to penicillin g.  MEDICAL HISTORY: Past Medical History:  Diagnosis Date   Anemia    Arthritis    Blood transfusion without reported diagnosis    Cancer (HCC)    Diabetes (HCC)    Family history of ovarian cancer    Family history of prostate cancer    Fibroid    Vaginal Pap smear, abnormal     SURGICAL HISTORY: Past Surgical History:  Procedure Laterality Date   ABDOMINAL HYSTERECTOMY     fibroids, left adnexa   BREAST BIOPSY Right 03/28/2024   US  RT BREAST BX W LOC DEV 1ST LESION IMG BX SPEC US  GUIDE 03/28/2024 [facility]   BREAST BIOPSY Right 03/28/2024   US  RT BREAST BX W LOC DEV EA ADD LESION IMG BX SPEC  US  GUIDE 03/28/2024 [facility]   PORTACATH PLACEMENT Left 04/14/2024   Procedure: INSERTION, TUNNELED CENTRAL VENOUS DEVICE, WITH PORT;  Surgeon: Curvin Deward MOULD, MD;  Location: [facility];  Service: General;  Laterality: Left;  PORT PLACEMENT WITH ULTRASOUND GUIDANCE    SOCIAL HISTORY: Social History   Socioeconomic History   Marital status: Single    Spouse name: Not on file    Number of children: 0   Years of education: Not on file   Highest education level: Not on file  Occupational History   Not on file  Tobacco Use   Smoking status: Former    Current packs/day: 0.00    Types: Cigarettes    Quit date: 01/1996    Years since quitting: 28.5   Smokeless tobacco: Never  Vaping Use   Vaping status: Never Used  Substance and Sexual Activity   Alcohol  use: Never   Drug use: Never   Sexual activity: Not Currently  Other Topics Concern   Not on file  Social History Narrative   Not on file   Social Drivers of Health   Financial Resource Strain: Low Risk  (06/27/2024)   Overall Financial Resource Strain (CARDIA)    Difficulty of Paying Living Expenses: Not hard at all  Food Insecurity: No Food Insecurity (06/27/2024)   Hunger Vital Sign    Worried About Running Out of Food in the Last Year: Never true    Ran Out of Food in the Last Year: Never true  Transportation Needs: No Transportation Needs (06/27/2024)   PRAPARE - Administrator, Civil Service (Medical): No    Lack of Transportation (Non-Medical): No  Physical Activity: Not on file  Stress: No Stress Concern Present (06/27/2024)   [employer] of Occupational Health - Occupational Stress Questionnaire    Feeling of Stress: Not at all  Social Connections: Unknown (06/27/2024)   Social Connection and Isolation Panel    Frequency of Communication with Friends and Family: Twice a week    Frequency of Social Gatherings with Friends and Family: Once a week    Attends Religious Services: Not on Insurance Claims Handler of Clubs or Organizations: No    Attends Banker Meetings: Never    Marital Status: Not on file  Intimate Partner Violence: Not At Risk (06/27/2024)   Humiliation, Afraid, Rape, and Kick questionnaire    Fear of Current or Ex-Partner: No    Emotionally Abused: No    Physically Abused: No    Sexually Abused: No    FAMILY HISTORY: Family History   Problem Relation Age of Onset   Cancer Brother    Prostate cancer Brother    Ovarian cancer Maternal Aunt    Cervical cancer Maternal Aunt    Breast cancer Neg Hx     Review of Systems - Oncology    PHYSICAL EXAMINATION    There were no vitals filed for this visit.  Physical Exam  LABORATORY DATA:  CBC    Component Value Date/Time   WBC 4.8 07/04/2024 1043   RBC 3.36 (L) 07/04/2024 1043   HGB 10.6 (L) 07/04/2024 1043   HCT 31.8 (L) 07/04/2024 1043   PLT 332 07/04/2024 1043   MCV 94.6 07/04/2024 1043   MCH 31.5 07/04/2024 1043   MCHC 33.3 07/04/2024 1043   RDW 16.4 (H) 07/04/2024 1043   LYMPHSABS 1.2 07/04/2024 1043   MONOABS 0.3 07/04/2024 1043   EOSABS 0.1 07/04/2024 1043  BASOSABS 0.0 07/04/2024 1043    CMP     Component Value Date/Time   NA 138 06/27/2024 1213   K 3.9 06/27/2024 1213   CL 106 06/27/2024 1213   CO2 23 06/27/2024 1213   GLUCOSE 189 (H) 06/27/2024 1213   BUN 16 06/27/2024 1213   CREATININE 0.68 06/27/2024 1213   CALCIUM 8.9 06/27/2024 1213   PROT 6.5 06/27/2024 1213   ALBUMIN 4.1 06/27/2024 1213   AST 17 06/27/2024 1213   ALT 14 06/27/2024 1213   ALKPHOS 47 06/27/2024 1213   BILITOT 0.3 06/27/2024 1213   GFRNONAA >60 06/27/2024 1213     ASSESSMENT and THERAPY PLAN:   No problem-specific Assessment & Plan notes found for this encounter.   Assessment and Plan Assessment & Plan       All questions were answered. The patient knows to call the clinic with any problems, questions or concerns. We can certainly see the patient much sooner if necessary.  Total encounter time:*** minutes*in face-to-face visit time, chart review, lab review, care coordination, order entry, and documentation of the encounter time.    [name], NP 07/04/24 11:54 AM Medical Oncology and Hematology [facility] [address] [place], [place] [address] Tel. [phone]    Fax. [phone]  *Total Encounter Time as defined  by the Centers for Medicare and Medicaid Services includes, in addition to the face-to-face time of a patient visit (documented in the note above) non-face-to-face time: obtaining and reviewing outside history, ordering and reviewing medications, tests or procedures, care coordination (communications with other health care professionals or caregivers) and documentation in the medical record.

## 2024-07-04 NOTE — Progress Notes (Signed)
 S - Subjective  Patient presents today for follow-up appointment prior to her schedule treatment Day 15, Cycle 4  No new complaints reported during intake.  All intake questions reviewed and answered.  O - Objective  Patient roomed for oncology visit.  Vital signs stable (VSS).  No acute distress observed.  A - Assessment  Patient is stable and appropriate for provider evaluation.  P - Plan  Pending recommendation from provider

## 2024-07-04 NOTE — Research (Unsigned)
 [name], ICE COMPRESS: RANDOMIZED TRIAL OF LIMB CRYOCOMPRESSION VERSUS CONTINUOUS COMPRESSION VERSUS LOW CYCLIC COMPRESSION FOR THE PREVENTION OF TAXANE-INDUCED PERIPHERAL NEUROPATHY    Week 12 Patient arrives today unaccompanied for the week 12 treatment. Confirmed patient does not have wounds, sores, or lesions to extremities. Patient has not had any vaccinations since last visit. Pt completed her PROs, neuropen testing, tuning fork testing, timed getup and go test, and device tolerability assessment. Laury Quale Clinical research coordinator assisted with timing of tests.   LABS: Optional labs are collected via port per consent and study protocol: [name] tolerated well without complaint.   MD/PROVIDER VISIT: Patient see [name] for today's visit.    ADVERSE EVENTS:   Solicited AEs reviewed with patient.  Pt stated that she is not experiencing any numbness in her fingertips today. She denies any new AE symptoms. See AE table below.      Adverse Event CTCAE Grade Onset date Resolved date Relationship to Study Intervention Action Taken Comments  Skin atrophy (solicited) 0            Skin hyperpigmentation (solicited) 0            Skin hypopigmentation (solicited) 0            Skin induration (solicited) 0            Skin ulceration (solicited) 0            Rash maculopapular (solicited) 0            Nail changes (solicited) 0            Cold intolerance (solicited) (general disorders and administration site conditions- other) 0            Frostbite (solicited) (skin and subcutaneous tissue disorders- other) 0            Nail discoloration (Non solicited)  1 05/09/24 Ongoing   None    Peripheral sensory neuropathy (Non solicited) 1 05/30/24 06/06/24 unrelated None        STUDY INTERVENTION & TOLERABILITY ASSESSMENTS: 1:15 pm am Device wraps applied; pre-treatment started set on Arm 1 CyroCompression with temperature setting at 11 degree celsius and cyclic pressure  5-15 mm Hg.  1:25 pm Required 5-15 minute Tolerability check- pt states tolerable to cyrocompression with temperature setting at 11 degree celsius and cyclic pressure 5-15 mm Hg. 8:50 pm Taxane infusion started and moved to treatment phase on Paxman machine.  Cyrocompression with temperature setting at 11 degree celsius and cyclic pressure 5-15 mm Hg. 7:57 pm Bathroom break. Wraps removed 2:51 pm Wraps replaced and intervention restarted.  2:54 pmTaxane infusion end time. Moved to post-treatment phase. 3:33 pm Wraps removed. Intervention ended. Pt made up bathroom break (9 minutes total) during post treatment phase per protocol. Confirmed no new wounds, sores, or lesions.      The patient was thanked for their time and continued voluntary participation in this study. She understands research nurse will meet her again at her week 24 time point. [name] has been provided direct contact information and is encouraged to contact this Nurse for any needs or question.   [name], RN, BSN Clinical Research Nurse [phone] 07/04/2024

## 2024-07-05 ENCOUNTER — Inpatient Hospital Stay

## 2024-07-05 VITALS — BP 144/86 | HR 79 | Resp 17

## 2024-07-05 DIAGNOSIS — Z5111 Encounter for antineoplastic chemotherapy: Secondary | ICD-10-CM | POA: Diagnosis not present

## 2024-07-05 DIAGNOSIS — Z171 Estrogen receptor negative status [ER-]: Secondary | ICD-10-CM

## 2024-07-05 MED ORDER — FILGRASTIM-SNDZ 300 MCG/0.5ML IJ SOSY
300.0000 ug | PREFILLED_SYRINGE | Freq: Once | INTRAMUSCULAR | Status: AC
  Administered 2024-07-05: 300 ug via SUBCUTANEOUS
  Filled 2024-07-05: qty 0.5

## 2024-07-06 ENCOUNTER — Encounter: Payer: Self-pay | Admitting: Hematology and Oncology

## 2024-07-06 ENCOUNTER — Inpatient Hospital Stay (HOSPITAL_BASED_OUTPATIENT_CLINIC_OR_DEPARTMENT_OTHER)

## 2024-07-06 VITALS — BP 125/76 | HR 88 | Temp 99.3°F | Resp 18

## 2024-07-06 DIAGNOSIS — C50811 Malignant neoplasm of overlapping sites of right female breast: Secondary | ICD-10-CM

## 2024-07-06 DIAGNOSIS — Z5111 Encounter for antineoplastic chemotherapy: Secondary | ICD-10-CM | POA: Diagnosis not present

## 2024-07-06 MED ORDER — FILGRASTIM-SNDZ 300 MCG/0.5ML IJ SOSY
300.0000 ug | PREFILLED_SYRINGE | Freq: Once | INTRAMUSCULAR | Status: AC
  Administered 2024-07-06: 300 ug via SUBCUTANEOUS
  Filled 2024-07-06: qty 0.5

## 2024-07-06 NOTE — Assessment & Plan Note (Signed)
 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%    CT CAP 04/12/2024: Right breast mass and prominent right axillary lymph nodes, calcified omental nodules (probably granulomatous disease) peritoneal carcinomatosis not excluded.,  Left ovary lesion 3.8 cm Bone scan 04/13/2024: 2 foci of increased uptake in the cervical spine region indeterminate recommend MRI spine Breast MRI 04/13/2024: Right breast non-mass enhancement 9 cm, 3 abnormal right axillary lymph nodes   Treatment plan: Neoadjuvant chemotherapy with Taxol  carbo Keytruda  weekly x 12 followed by Adriamycin Cytoxan Keytruda  followed by Keytruda  maintenance Breast conserving surgery with ALND Adjuvant radiation therapy   GYN evaluation for the ovarian adnexal lesion: Status post pelvic ultrasound.  Dr. Comer Dollar intends to do surgery in conjunction with breast surgery -------------------------------------------------------------------------------------------------------------------- Current treatment:  Taxol  carbo Keytruda  (started 04/19/2024) participation in S2205 clinical trial

## 2024-07-07 ENCOUNTER — Inpatient Hospital Stay

## 2024-07-07 VITALS — BP 131/73 | HR 98 | Resp 18

## 2024-07-07 DIAGNOSIS — Z5111 Encounter for antineoplastic chemotherapy: Secondary | ICD-10-CM | POA: Diagnosis not present

## 2024-07-07 DIAGNOSIS — C50811 Malignant neoplasm of overlapping sites of right female breast: Secondary | ICD-10-CM

## 2024-07-07 MED ORDER — FILGRASTIM-SNDZ 300 MCG/0.5ML IJ SOSY
300.0000 ug | PREFILLED_SYRINGE | Freq: Once | INTRAMUSCULAR | Status: AC
  Administered 2024-07-07: 300 ug via SUBCUTANEOUS
  Filled 2024-07-07: qty 0.5

## 2024-07-11 ENCOUNTER — Inpatient Hospital Stay

## 2024-07-11 ENCOUNTER — Inpatient Hospital Stay: Admitting: Hematology and Oncology

## 2024-07-11 VITALS — BP 130/78 | HR 88 | Temp 98.4°F | Resp 18 | Ht 63.0 in | Wt 144.4 lb

## 2024-07-11 VITALS — BP 132/70 | HR 87 | Temp 98.4°F | Resp 16

## 2024-07-11 DIAGNOSIS — C50811 Malignant neoplasm of overlapping sites of right female breast: Secondary | ICD-10-CM | POA: Diagnosis not present

## 2024-07-11 DIAGNOSIS — Z171 Estrogen receptor negative status [ER-]: Secondary | ICD-10-CM

## 2024-07-11 DIAGNOSIS — Z5111 Encounter for antineoplastic chemotherapy: Secondary | ICD-10-CM | POA: Diagnosis not present

## 2024-07-11 LAB — CBC WITH DIFFERENTIAL (CANCER CENTER ONLY)
Abs Immature Granulocytes: 0.22 K/uL — ABNORMAL HIGH (ref 0.00–0.07)
Basophils Absolute: 0.1 K/uL (ref 0.0–0.1)
Basophils Relative: 1 %
Eosinophils Absolute: 0.1 K/uL (ref 0.0–0.5)
Eosinophils Relative: 1 %
HCT: 34 % — ABNORMAL LOW (ref 36.0–46.0)
Hemoglobin: 11.1 g/dL — ABNORMAL LOW (ref 12.0–15.0)
Immature Granulocytes: 3 %
Lymphocytes Relative: 25 %
Lymphs Abs: 1.8 K/uL (ref 0.7–4.0)
MCH: 30.8 pg (ref 26.0–34.0)
MCHC: 32.6 g/dL (ref 30.0–36.0)
MCV: 94.4 fL (ref 80.0–100.0)
Monocytes Absolute: 1 K/uL (ref 0.1–1.0)
Monocytes Relative: 14 %
Neutro Abs: 3.9 K/uL (ref 1.7–7.7)
Neutrophils Relative %: 56 %
Platelet Count: 249 K/uL (ref 150–400)
RBC: 3.6 MIL/uL — ABNORMAL LOW (ref 3.87–5.11)
RDW: 16.1 % — ABNORMAL HIGH (ref 11.5–15.5)
WBC Count: 7.1 K/uL (ref 4.0–10.5)
nRBC: 0 % (ref 0.0–0.2)

## 2024-07-11 LAB — CMP (CANCER CENTER ONLY)
ALT: 16 U/L (ref 0–44)
AST: 18 U/L (ref 15–41)
Albumin: 3.9 g/dL (ref 3.5–5.0)
Alkaline Phosphatase: 71 U/L (ref 38–126)
Anion gap: 9 (ref 5–15)
BUN: 12 mg/dL (ref 8–23)
CO2: 23 mmol/L (ref 22–32)
Calcium: 8.7 mg/dL — ABNORMAL LOW (ref 8.9–10.3)
Chloride: 107 mmol/L (ref 98–111)
Creatinine: 0.64 mg/dL (ref 0.44–1.00)
GFR, Estimated: >60 mL/min (ref >60)
Glucose, Bld: 187 mg/dL — ABNORMAL HIGH (ref 70–99)
Potassium: 4.1 mmol/L (ref 3.5–5.1)
Sodium: 138 mmol/L (ref 135–145)
Total Bilirubin: 0.2 mg/dL (ref 0.0–1.2)
Total Protein: 6.4 g/dL — ABNORMAL LOW (ref 6.5–8.1)

## 2024-07-11 MED ORDER — APREPITANT 130 MG/18ML IV EMUL
130.0000 mg | Freq: Once | INTRAVENOUS | Status: AC
  Administered 2024-07-11: 130 mg via INTRAVENOUS
  Filled 2024-07-11: qty 18

## 2024-07-11 MED ORDER — DOXORUBICIN HCL CHEMO IV INJECTION 2 MG/ML
60.0000 mg/m2 | Freq: Once | INTRAVENOUS | Status: AC
  Administered 2024-07-11: 104 mg via INTRAVENOUS
  Filled 2024-07-11: qty 52

## 2024-07-11 MED ORDER — SODIUM CHLORIDE 0.9 % IV SOLN: INTRAVENOUS | Status: DC

## 2024-07-11 MED ORDER — PALONOSETRON HCL INJECTION 0.25 MG/5ML
0.2500 mg | Freq: Once | INTRAVENOUS | Status: AC
  Administered 2024-07-11: 0.25 mg via INTRAVENOUS
  Filled 2024-07-11: qty 5

## 2024-07-11 MED ORDER — DEXAMETHASONE SOD PHOSPHATE PF 10 MG/ML IJ SOLN
10.0000 mg | Freq: Once | INTRAMUSCULAR | Status: AC
  Administered 2024-07-11: 10 mg via INTRAVENOUS

## 2024-07-11 MED ORDER — SODIUM CHLORIDE 0.9 % IV SOLN
200.0000 mg | Freq: Once | INTRAVENOUS | Status: AC
  Administered 2024-07-11: 200 mg via INTRAVENOUS
  Filled 2024-07-11: qty 200

## 2024-07-11 MED ORDER — SODIUM CHLORIDE 0.9 % IV SOLN
600.0000 mg/m2 | Freq: Once | INTRAVENOUS | Status: AC
  Administered 2024-07-11: 1000 mg via INTRAVENOUS
  Filled 2024-07-11: qty 50

## 2024-07-11 NOTE — Patient Instructions (Signed)
 [facility] - A DEPT OF [facility]  Discharge Instructions: Thank you for choosing [facility] to provide your oncology and hematology care.   If you have a lab appointment with the Cancer Center, please go directly to the Cancer Center and check in at the registration area.   Wear comfortable clothing and clothing appropriate for easy access to any Portacath or PICC line.   We strive to give you quality time with your provider. You may need to reschedule your appointment if you arrive late (15 or more minutes).  Arriving late affects you and other patients whose appointments are after yours.  Also, if you miss three or more appointments without notifying the office, you may be dismissed from the clinic at the provider's discretion.      For prescription refill requests, have your pharmacy contact our office and allow 72 hours for refills to be completed.    Today you received the following chemotherapy and/or immunotherapy agents: Pembrolizumab  (keytruda ) Doxorubicin , Cytoxan  (cyclophasphomide)   To help prevent nausea and vomiting after your treatment, we encourage you to take your nausea medication as directed.  BELOW ARE SYMPTOMS THAT SHOULD BE REPORTED IMMEDIATELY: *FEVER GREATER THAN 100.4 F (38 C) OR HIGHER *CHILLS OR SWEATING *NAUSEA AND VOMITING THAT IS NOT CONTROLLED WITH YOUR NAUSEA MEDICATION *UNUSUAL SHORTNESS OF BREATH *UNUSUAL BRUISING OR BLEEDING *URINARY PROBLEMS (pain or burning when urinating, or frequent urination) *BOWEL PROBLEMS (unusual diarrhea, constipation, pain near the anus) TENDERNESS IN MOUTH AND THROAT WITH OR WITHOUT PRESENCE OF ULCERS (sore throat, sores in mouth, or a toothache) UNUSUAL RASH, SWELLING OR PAIN  UNUSUAL VAGINAL DISCHARGE OR ITCHING   Items with * indicate a potential emergency and should be followed up as soon as possible or go to the Emergency Department if any problems should  occur.  Please show the CHEMOTHERAPY ALERT CARD or IMMUNOTHERAPY ALERT CARD at check-in to the Emergency Department and triage nurse.  Should you have questions after your visit or need to cancel or reschedule your appointment, please contact [facility] - [facility]PT [facility]  Dept: [phone]  and follow the prompts.  Office hours are 8:00 a.m. to 4:30 p.m. Monday - Friday. Please note that voicemails left after 4:00 p.m. may not be returned until the following business day.  We are closed weekends and major holidays. You have access to a nurse at all times for urgent questions. Please call the main number to the clinic Dept: [phone] and follow the prompts.   For any non-urgent questions, you may also contact your provider using MyChart. We now offer e-Visits for anyone 87 and older to request care online for non-urgent symptoms. For details visit [email or web address].   Also download the MyChart app! Go to the app store, search MyChart, open the app, select [place], and log in with your MyChart username and password.

## 2024-07-11 NOTE — Progress Notes (Signed)
 Patient Care Team: [name], MD as PCP - General (Family Medicine) Gerome, Devere HERO, RN as Oncology Nurse Navigator Tyree Nanetta SAILOR, RN as Oncology Nurse Navigator Odean Potts, MD as Consulting Physician (Hematology and Oncology) Dewey Rush, MD as Consulting Physician (Radiation Oncology) Curvin Deward MOULD, MD as Consulting Physician (General Surgery)  DIAGNOSIS:  Encounter Diagnosis  Name Primary?   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  The report date is April 19, 2024.  The CancerNext-Expanded gene panel offered by [facility] and includes sequencing, rearrangement, and RNA analysis for the following 77 genes: AIP, ALK, APC, ATM, BAP1, BARD1, BMPR1A, BRCA1, BRCA2, BRIP1, CDC73, CDH1, CDK4, CDKN1B, CDKN2A, CEBPA, CHEK2, CTNNA1, DDX41, DICER1, ETV6, FH, FLCN, GATA2, LZTR1, MAX, MBD4, MEN1, MET, MLH1, MSH2, MSH3, MSH6, MUTYH, NF1, NF2, NTHL1, PALB2, PHOX2B, PMS2, POT1, PRKAR1A, PTCH1, PTEN, RAD51C, RAD51D, RB1,  RET, RPS20, RUNX1, SDHA, SDHAF2, SDHB, SDHC, SDHD, SMAD4, SMARCA4, SMARCB1, SMARCE1, STK11, SUFU, TMEM127, TP53, TSC1, TSC2, VHL, and WT1 (sequencing and deletion/duplication); AXIN2, CTNNA1, DDX41, EGFR, HOXB13, KIT, MBD4, MITF, MSH3, PDGFRA, POLD1 and POLE (sequencing only); EPCAM and GREM1 (deletion/duplication only). RNA data is routinely analyzed for use in variant interpretation for all genes.      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  She reports she has completed Taxol , Carboplatin , and Keytruda  and will be starting Adriamycin , Cytoxan , and Keytruda . She understands this regimen may cause increased fatigue. She has not had significant nausea and has not needed antiemetics. She will receive a long-acting granulocyte colony-stimulating factor injection later this week and was advised to take daily loratadine for expected bone pain.  She has daily mild epistaxis, most prominent in the mornings, which responds to local pressure and cold compresses. She avoids forceful nose blowing but still needs to clear her nose to breathe comfortably. She has no severe bleeding or signs of infection.  This morning she noticed a new hard, slightly tender subcutaneous nodule below her inguinal region. It is not erythematous or increasingly painful. She is monitoring for changes and denies fevers or chills.  Her prior concern for oral candidiasis has resolved, and she has no current oral lesions.  She has scheduled a mammogram and ultrasound to assess treatment response. She is concerned about the necessity of both studies and potential insurance coverage issues, including a recent $2,700 bill  for Keytruda  and lack of supplemental insurance. She is worried about future  coverage.  Jun 13, 2024: Cycle 9 of Taxol  and Carboplatin  chemotherapy administered for Stage IIB triple-negative right breast cancer; patient experiencing mild fatigue, slight leukopenia, nailbed discoloration, chemotherapy-induced anemia, and stable peripheral neuropathy. Port management issues addressed with positional adjustments. Surgery and radiation therapy planned post-chemotherapy; clinical trial for surgery avoidance considered if complete response achieved.     ALLERGIES:  is allergic to penicillin g.  MEDICATIONS:  Current Outpatient Medications  Medication Sig Dispense Refill   atorvastatin (LIPITOR) 40 MG tablet Take 40 mg by mouth.     cholecalciferol (VITAMIN D3) 25 MCG (1000 UNIT) tablet Take 1,000 Units by mouth daily.     dapagliflozin propanediol (FARXIGA) 10 MG TABS tablet Take 10 mg by mouth daily.     dexamethasone  (DECADRON ) 4 MG tablet Take 2 tablets daily for 2 days beginning the day after chemotherapy.  Take with food. 30 tablet 0   fluconazole  (DIFLUCAN ) 200 MG tablet Take 1 tablet (200 mg total) by mouth daily. 3 tablet 1   lidocaine -prilocaine  (EMLA ) cream Apply to affected area once 30 g 3   lisinopril (ZESTRIL) 5 MG tablet Take 5 mg by mouth.     meloxicam (MOBIC) 7.5 MG tablet Take 7.5 mg by mouth daily as needed.     ondansetron  (ZOFRAN ) 8 MG tablet Take 1 tablet (8 mg total) by mouth every 8 (eight) hours as needed for nausea or vomiting. Start on the third day after chemotherapy. 30 tablet 1   oxyCODONE  (ROXICODONE ) 5 MG immediate release tablet Take 1 tablet (5 mg total) by mouth every 6 (six) hours as needed. 10 tablet 0   prochlorperazine  (COMPAZINE ) 10 MG tablet Take 1 tablet (10 mg total) by mouth every 6 (six) hours as needed for nausea or vomiting. 30 tablet 1   pantoprazole  (PROTONIX ) 40 MG tablet Take 1 tablet (40 mg total) by mouth daily. (Patient not taking: Reported on 07/11/2024) 30 tablet 3   No current facility-administered medications for  this visit.    PHYSICAL EXAMINATION: ECOG PERFORMANCE STATUS: 1 - Symptomatic but completely ambulatory  Vitals:   07/11/24 1127  BP: 130/78  Pulse: 88  Resp: 18  Temp: 98.4 F (36.9 C)  SpO2: 100%   Filed Weights   07/11/24 1127  Weight: 144 lb 6.4 oz (65.5 kg)    Physical Exam HEENT: Oral cavity normal. GENITOURINARY: Tender bump below panty line, possibly inflamed hair follicle.  (exam performed in the presence of a chaperone)  LABORATORY DATA:  I have reviewed the data as listed    Latest Ref Rng & Units 07/04/2024   10:43 AM 06/27/2024   12:13 PM 06/20/2024   11:32 AM  CMP  Glucose 70 - 99 mg/dL 854  810  832   BUN 8 - 23 mg/dL 14  16  16    Creatinine 0.44 - 1.00 mg/dL 9.39  9.31  9.35   Sodium 135 - 145 mmol/L 139  138  139   Potassium 3.5 - 5.1 mmol/L 4.1  3.9  4.2   Chloride 98 - 111 mmol/L 108  106  105   CO2 22 - 32 mmol/L 22  23  25    Calcium 8.9 - 10.3 mg/dL 8.6  8.9  9.2   Total Protein 6.5 - 8.1 g/dL 6.3  6.5  6.2   Total Bilirubin 0.0 - 1.2 mg/dL 0.2  0.3  0.2   Alkaline Phos 38 - 126  U/L 47  47  69   AST 15 - 41 U/L 15  17  17    ALT 0 - 44 U/L 11  14  8      Lab Results  Component Value Date   WBC 7.1 07/11/2024   HGB 11.1 (L) 07/11/2024   HCT 34.0 (L) 07/11/2024   MCV 94.4 07/11/2024   PLT 249 07/11/2024   NEUTROABS 3.9 07/11/2024    ASSESSMENT & PLAN:  Malignant neoplasm of overlapping sites of right breast in female, estrogen receptor negative (HCC) 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%    CT CAP 04/12/2024: Right breast mass and prominent right axillary lymph nodes, calcified omental nodules (probably granulomatous disease) peritoneal carcinomatosis not excluded.,  Left ovary lesion 3.8 cm Bone scan 04/13/2024: 2 foci of increased uptake in the cervical spine region indeterminate recommend MRI  spine Breast MRI 04/13/2024: Right breast non-mass enhancement 9 cm, 3 abnormal right axillary lymph nodes   Treatment plan: Neoadjuvant chemotherapy with Taxol  carbo Keytruda  weekly x 12 followed by Adriamycin  Cytoxan  Keytruda  followed by Keytruda  maintenance Breast conserving surgery with ALND Adjuvant radiation therapy   GYN evaluation for the ovarian adnexal lesion: Status post pelvic ultrasound.  Dr. Comer Dollar intends to do surgery in conjunction with breast surgery -------------------------------------------------------------------------------------------------------------------- Current treatment: Completed 12 cycles of Taxol  carbo Keytruda  (started 04/19/2024) today is cycle 1 Adriamycin  Cytoxan  and Keytruda  participation in S2205 clinical trial    Chemo toxicities: Fatigue: Mild Slight leukopenia: We lowered the dosage of Taxol , stable white blood cell counts Nailbed discoloration Chemotherapy-induced anemia: Today's hemoglobin is 10.9 Peripheral neuropathy: Stable    Otherwise tolerating chemo fairly well. Return to clinic in 1 week for toxicity check ------------------------------------- Assessment and Plan Assessment & Plan Malignant neoplasm of overlapping sites of right breast, estrogen receptor negative Transitioned to Adriamycin , Cytoxan , and Keytruda  regimen to enhance response. - Provided prophylactic antiemetics (ondansetron , prochlorperazine ), Claritin for bone pain, dexamethasone  for nausea, and Miralax for constipation as needed. - Discussed rationale for switching chemotherapy agents. - Discussed that imaging results would not alter treatment plan; she elected to cancel scheduled mammogram and ultrasound. - Advised to contact office via MyChart if she wishes to proceed with imaging.  Chemotherapy-induced anemia Mild anemia secondary to chemotherapy, hemoglobin at 11.1 g/dL. Anticipated further decline with ongoing treatment.  Chemotherapy-induced  neutropenia At risk for neutropenia due to ongoing chemotherapy. Current WBC count normal. - Instructed her to take Claritin daily for 7-9 days to prevent bone pain associated with G-CSF.  Chemotherapy-induced fatigue Fatigue expected with current and upcoming chemotherapy regimens, with anticipated increase in severity. Cumulative fatigue noted with prior cycles. - Provided anticipatory guidance regarding increased fatigue with new regimen.  Chemotherapy-induced epistaxis Ongoing mild epistaxis, likely secondary to prior Taxol  chemotherapy. Managed with local pressure and cold compresses.  - Advised gentle nasal care and use of local pressure with cold compresses as needed. - Instructed her to avoid forceful nose blowing to minimize trauma. - Monitored for worsening or severe bleeding.  Possible folliculitis New tender, hard bump in right lower abdomen, likely localized folliculitis or small abscess, possibly related to local irritation and immunosuppression from chemotherapy. No signs of systemic infection. - Advised monitoring for increased redness, pain, or swelling. - Instructed her to report worsening symptoms or signs of infection for consideration of antibiotics.      No orders of  the defined types were placed in this encounter.  The patient has a good understanding of the overall plan. she agrees with it. she will call with any problems that may develop before the next visit here.  I personally spent a total of 30 minutes in the care of the patient today including preparing to see the patient, getting/reviewing separately obtained history, performing a medically appropriate exam/evaluation, counseling and educating, placing orders, referring and communicating with other health care professionals, documenting clinical information in the EHR, independently interpreting results, communicating results, and coordinating care.   Viinay K Faraaz Wolin, MD 07/11/24

## 2024-07-11 NOTE — Assessment & Plan Note (Signed)
 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%    CT CAP 04/12/2024: Right breast mass and prominent right axillary lymph nodes, calcified omental nodules (probably granulomatous disease) peritoneal carcinomatosis not excluded.,  Left ovary lesion 3.8 cm Bone scan 04/13/2024: 2 foci of increased uptake in the cervical spine region indeterminate recommend MRI spine Breast MRI 04/13/2024: Right breast non-mass enhancement 9 cm, 3 abnormal right axillary lymph nodes   Treatment plan: Neoadjuvant chemotherapy with Taxol  carbo Keytruda  weekly x 12 followed by Adriamycin  Cytoxan  Keytruda  followed by Keytruda  maintenance Breast conserving surgery with ALND Adjuvant radiation therapy   GYN evaluation for the ovarian adnexal lesion: Status post pelvic ultrasound.  Dr. Comer Dollar intends to do surgery in conjunction with breast surgery -------------------------------------------------------------------------------------------------------------------- Current treatment: Completed 12 cycles of Taxol  carbo Keytruda  (started 04/19/2024) today is cycle 1 Adriamycin  Cytoxan  and Keytruda  participation in S2205 clinical trial    Chemo toxicities: Fatigue: Mild Slight leukopenia: We lowered the dosage of Taxol , stable white blood cell counts Nailbed discoloration Chemotherapy-induced anemia: Today's hemoglobin is 10.9 Peripheral neuropathy: Stable    Otherwise tolerating chemo fairly well. Return to clinic in 1 week for toxicity check

## 2024-07-13 ENCOUNTER — Inpatient Hospital Stay

## 2024-07-13 VITALS — BP 131/74 | HR 66 | Temp 98.6°F | Resp 16

## 2024-07-13 DIAGNOSIS — Z171 Estrogen receptor negative status [ER-]: Secondary | ICD-10-CM

## 2024-07-13 DIAGNOSIS — Z5111 Encounter for antineoplastic chemotherapy: Secondary | ICD-10-CM | POA: Diagnosis not present

## 2024-07-13 MED ADMIN — Pegfilgrastim-jmdb Soln Prefilled Syringe 6 MG/0.6ML: 6 mg | SUBCUTANEOUS | NDC 83257000541

## 2024-07-13 MED FILL — Pegfilgrastim-jmdb Soln Prefilled Syringe 6 MG/0.6ML: 6.0000 mg | SUBCUTANEOUS | Qty: 0.6 | Status: AC

## 2024-07-17 ENCOUNTER — Encounter

## 2024-07-17 ENCOUNTER — Other Ambulatory Visit

## 2024-07-17 ENCOUNTER — Other Ambulatory Visit: Payer: Self-pay | Admitting: *Deleted

## 2024-07-17 DIAGNOSIS — R1900 Intra-abdominal and pelvic swelling, mass and lump, unspecified site: Secondary | ICD-10-CM

## 2024-07-17 DIAGNOSIS — N838 Other noninflammatory disorders of ovary, fallopian tube and broad ligament: Secondary | ICD-10-CM

## 2024-07-17 MED ORDER — AMOXICILLIN-POT CLAVULANATE 875-125 MG PO TABS: 1.0000 | ORAL_TABLET | Freq: Two times a day (BID) | ORAL | 0 refills | Status: DC

## 2024-07-17 NOTE — Progress Notes (Signed)
 Pt called stating area at pelvis has become bigger and irritated. States she took 2 tylenol's which helped with the pain a little. Advised that Dr.Gudena, would like for her to start ABT for 7 days and if it doesn't resolve will f/u. Also, advised that referral will be sent to surgeon. Pt states that she would try ABT first.

## 2024-08-01 ENCOUNTER — Inpatient Hospital Stay

## 2024-08-01 ENCOUNTER — Inpatient Hospital Stay (HOSPITAL_BASED_OUTPATIENT_CLINIC_OR_DEPARTMENT_OTHER): Admitting: Adult Health

## 2024-08-01 ENCOUNTER — Encounter: Payer: Self-pay | Admitting: Adult Health

## 2024-08-01 VITALS — BP 134/73 | HR 85 | Temp 97.3°F | Resp 18 | Wt 148.2 lb

## 2024-08-01 DIAGNOSIS — C50811 Malignant neoplasm of overlapping sites of right female breast: Secondary | ICD-10-CM | POA: Diagnosis not present

## 2024-08-01 DIAGNOSIS — Z171 Estrogen receptor negative status [ER-]: Secondary | ICD-10-CM

## 2024-08-01 DIAGNOSIS — Z5111 Encounter for antineoplastic chemotherapy: Secondary | ICD-10-CM | POA: Diagnosis not present

## 2024-08-01 LAB — CBC WITH DIFFERENTIAL (CANCER CENTER ONLY)
Abs Immature Granulocytes: 0.01 K/uL (ref 0.00–0.07)
Basophils Absolute: 0.1 K/uL (ref 0.0–0.1)
Basophils Relative: 2 %
Eosinophils Absolute: 0.3 K/uL (ref 0.0–0.5)
Eosinophils Relative: 5 %
HCT: 31.7 % — ABNORMAL LOW (ref 36.0–46.0)
Hemoglobin: 10.5 g/dL — ABNORMAL LOW (ref 12.0–15.0)
Immature Granulocytes: 0 %
Lymphocytes Relative: 28 %
Lymphs Abs: 1.3 K/uL (ref 0.7–4.0)
MCH: 31.4 pg (ref 26.0–34.0)
MCHC: 33.1 g/dL (ref 30.0–36.0)
MCV: 94.9 fL (ref 80.0–100.0)
Monocytes Absolute: 0.8 K/uL (ref 0.1–1.0)
Monocytes Relative: 16 %
Neutro Abs: 2.3 K/uL (ref 1.7–7.7)
Neutrophils Relative %: 49 %
Platelet Count: 422 K/uL — ABNORMAL HIGH (ref 150–400)
RBC: 3.34 MIL/uL — ABNORMAL LOW (ref 3.87–5.11)
RDW: 15.9 % — ABNORMAL HIGH (ref 11.5–15.5)
WBC Count: 4.8 K/uL (ref 4.0–10.5)
nRBC: 0 % (ref 0.0–0.2)

## 2024-08-01 LAB — CMP (CANCER CENTER ONLY)
ALT: 8 U/L (ref 0–44)
AST: 16 U/L (ref 15–41)
Albumin: 3.9 g/dL (ref 3.5–5.0)
Alkaline Phosphatase: 67 U/L (ref 38–126)
Anion gap: 9 (ref 5–15)
BUN: 12 mg/dL (ref 8–23)
CO2: 24 mmol/L (ref 22–32)
Calcium: 8.9 mg/dL (ref 8.9–10.3)
Chloride: 107 mmol/L (ref 98–111)
Creatinine: 0.64 mg/dL (ref 0.44–1.00)
GFR, Estimated: >60 mL/min
Glucose, Bld: 188 mg/dL — ABNORMAL HIGH (ref 70–99)
Potassium: 3.9 mmol/L (ref 3.5–5.1)
Sodium: 140 mmol/L (ref 135–145)
Total Bilirubin: 0.2 mg/dL (ref 0.0–1.2)
Total Protein: 6.5 g/dL (ref 6.5–8.1)

## 2024-08-01 LAB — TSH: TSH: 0.667 u[IU]/mL (ref 0.350–4.500)

## 2024-08-01 MED ORDER — APREPITANT 130 MG/18ML IV EMUL
130.0000 mg | Freq: Once | INTRAVENOUS | Status: AC
  Administered 2024-08-01: 130 mg via INTRAVENOUS
  Filled 2024-08-01: qty 18

## 2024-08-01 MED ORDER — DEXAMETHASONE SOD PHOSPHATE PF 10 MG/ML IJ SOLN
10.0000 mg | Freq: Once | INTRAMUSCULAR | Status: AC
  Administered 2024-08-01: 10 mg via INTRAVENOUS

## 2024-08-01 MED ORDER — DOXORUBICIN HCL CHEMO IV INJECTION 2 MG/ML
60.0000 mg/m2 | Freq: Once | INTRAVENOUS | Status: AC
  Administered 2024-08-01: 104 mg via INTRAVENOUS
  Filled 2024-08-01: qty 52

## 2024-08-01 MED ORDER — SODIUM CHLORIDE 0.9 % IV SOLN: INTRAVENOUS | Status: DC

## 2024-08-01 MED ORDER — SODIUM CHLORIDE 0.9 % IV SOLN
600.0000 mg/m2 | Freq: Once | INTRAVENOUS | Status: AC
  Administered 2024-08-01: 1000 mg via INTRAVENOUS
  Filled 2024-08-01: qty 50

## 2024-08-01 MED ORDER — SODIUM CHLORIDE 0.9 % IV SOLN
200.0000 mg | Freq: Once | INTRAVENOUS | Status: AC
  Administered 2024-08-01: 200 mg via INTRAVENOUS
  Filled 2024-08-01: qty 200

## 2024-08-01 MED ORDER — PALONOSETRON HCL INJECTION 0.25 MG/5ML
0.2500 mg | Freq: Once | INTRAVENOUS | Status: AC
  Administered 2024-08-01: 0.25 mg via INTRAVENOUS
  Filled 2024-08-01: qty 5

## 2024-08-01 NOTE — Progress Notes (Unsigned)
 [facility] Cancer Follow up:    Christy Francisco, MD [address] [place] [address]   DIAGNOSIS: Cancer Staging  Malignant neoplasm of overlapping sites of right breast in female, estrogen receptor negative (HCC) Staging form: Breast, AJCC 8th Edition - Clinical: Stage IIB (cT1c, cN1, cM0, G3, ER-, PR-, HER2-) - Signed by Odean Potts, MD on 04/05/2024 Stage prefix: Initial diagnosis Histologic grading system: 3 grade system    SUMMARY OF ONCOLOGIC HISTORY: Oncology History  Malignant neoplasm of overlapping sites of right breast in female, estrogen receptor negative (HCC)  03/28/2024 Initial Diagnosis   Screening mammogram detected right breast asymmetry, 2 lesions 1.9 cm at 10:00 and 0.7 cm at the 11:00: Biopsy: 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  The report date is April 19, 2024.  The CancerNext-Expanded gene panel offered by [facility] and includes sequencing, rearrangement, and RNA analysis for the following 77 genes: AIP, ALK, APC, ATM, BAP1, BARD1, BMPR1A, BRCA1, BRCA2, BRIP1, CDC73, CDH1, CDK4, CDKN1B, CDKN2A, CEBPA, CHEK2, CTNNA1, DDX41, DICER1, ETV6, FH, FLCN, GATA2, LZTR1, MAX, MBD4, MEN1, MET, MLH1, MSH2, MSH3, MSH6, MUTYH, NF1, NF2, NTHL1, PALB2, PHOX2B, PMS2, POT1, PRKAR1A, PTCH1, PTEN, RAD51C, RAD51D, RB1, RET, RPS20, RUNX1, SDHA, SDHAF2, SDHB, SDHC, SDHD, SMAD4, SMARCA4,  SMARCB1, SMARCE1, STK11, SUFU, TMEM127, TP53, TSC1, TSC2, VHL, and WT1 (sequencing and deletion/duplication); AXIN2, CTNNA1, DDX41, EGFR, HOXB13, KIT, MBD4, MITF, MSH3, PDGFRA, POLD1 and POLE (sequencing only); EPCAM and GREM1 (deletion/duplication only). RNA data is routinely analyzed for use in variant interpretation for all genes.      CURRENT THERAPY:  INTERVAL HISTORY:  Discussed the use of AI scribe software for clinical note transcription with the patient, who gave verbal consent to proceed.  History of Present Illness      Patient Active Problem List   Diagnosis Date Noted   Genetic testing 04/24/2024   Family history of prostate cancer    Family history of ovarian cancer    Malignant neoplasm of overlapping sites of right breast in female, estrogen receptor negative (HCC) 04/04/2024    is allergic to penicillin g.  MEDICAL HISTORY: Past Medical History:  Diagnosis Date   Anemia    Arthritis    Blood transfusion without reported diagnosis    Cancer (HCC)    Diabetes (HCC)    Family history of ovarian cancer    Family history of prostate cancer    Fibroid    Vaginal Pap smear, abnormal     SURGICAL HISTORY: Past Surgical History:  Procedure Laterality Date   ABDOMINAL HYSTERECTOMY     fibroids, left adnexa   BREAST BIOPSY Right 03/28/2024   US  RT BREAST BX W LOC DEV 1ST LESION IMG BX SPEC US  GUIDE 03/28/2024 [facility]AMMOGR[facility]   BREAST BIOPSY Right 03/28/2024   US  RT BREAST BX W LOC DEV EA ADD LESION IMG BX SPEC US  GUIDE 03/28/2024 [facility]MO[facility]  PORTACATH PLACEMENT Left 04/14/2024   Procedure: INSERTION, TUNNELED CENTRAL VENOUS DEVICE, WITH PORT;  Surgeon: Curvin Deward MOULD, MD;  Location: [facility];  Service: General;  Laterality: Left;  PORT PLACEMENT WITH ULTRASOUND GUIDANCE    SOCIAL HISTORY: Social History   Socioeconomic History   Marital status: Single    Spouse name: Not on file   Number of children: 0   Years of education:  Not on file   Highest education level: Not on file  Occupational History   Not on file  Tobacco Use   Smoking status: Former    Current packs/day: 0.00    Types: Cigarettes    Quit date: 01/1996    Years since quitting: 28.6   Smokeless tobacco: Never  Vaping Use   Vaping status: Never Used  Substance and Sexual Activity   Alcohol  use: Never   Drug use: Never   Sexual activity: Not Currently  Other Topics Concern   Not on file  Social History Narrative   Not on file   Social Drivers of Health   Tobacco Use: Medium Risk (08/01/2024)   Patient History    Smoking Tobacco Use: Former    Smokeless Tobacco Use: Never    Passive Exposure: Not on Actuary Strain: Low Risk (06/27/2024)   Overall Financial Resource Strain (CARDIA)    Difficulty of Paying Living Expenses: Not hard at all  Food Insecurity: No Food Insecurity (08/01/2024)   Epic    Worried About Programme Researcher, Broadcasting/film/video in the Last Year: Never true    Ran Out of Food in the Last Year: Never true  Transportation Needs: No Transportation Needs (08/01/2024)   Epic    Lack of Transportation (Medical): No    Lack of Transportation (Non-Medical): No  Physical Activity: Not on file  Stress: No Stress Concern Present (06/27/2024)   [employer] of Occupational Health - Occupational Stress Questionnaire    Feeling of Stress: Not at all  Social Connections: Unknown (06/27/2024)   Social Connection and Isolation Panel    Frequency of Communication with Friends and Family: Twice a week    Frequency of Social Gatherings with Friends and Family: Once a week    Attends Religious Services: Not on file    Active Member of Clubs or Organizations: No    Attends Banker Meetings: Never    Marital Status: Not on file  Intimate Partner Violence: Not At Risk (08/01/2024)   Epic    Fear of Current or Ex-Partner: No    Emotionally Abused: No    Physically Abused: No    Sexually Abused: No  Depression  (PHQ2-9): Low Risk (07/04/2024)   Depression (PHQ2-9)    PHQ-2 Score: 0  Alcohol  Screen: Low Risk (06/27/2024)   Alcohol  Screen    Last Alcohol  Screening Score (AUDIT): 0  Housing: Unknown (08/01/2024)   Epic    Unable to Pay for Housing in the Last Year: No    Number of Times Moved in the Last Year: Not on file    Homeless in the Last Year: No  Utilities: Not At Risk (08/01/2024)   Epic    Threatened with loss of utilities: No  Health Literacy: Adequate Health Literacy (08/01/2024)   B1300 Health Literacy    Frequency of need for help with medical instructions: Never    FAMILY HISTORY: Family History  Problem Relation Age of Onset   Cancer Brother    Prostate cancer Brother  Ovarian cancer Maternal Aunt    Cervical cancer Maternal Aunt    Breast cancer Neg Hx     Review of Systems - Oncology    PHYSICAL EXAMINATION    Vitals:   08/01/24 1144  BP: 134/73  Pulse: 85  Resp: 18  Temp: (!) 97.3 F (36.3 C)  SpO2: 100%    Physical Exam  LABORATORY DATA:  CBC    Component Value Date/Time   WBC 4.8 08/01/2024 1054   RBC 3.34 (L) 08/01/2024 1054   HGB 10.5 (L) 08/01/2024 1054   HCT 31.7 (L) 08/01/2024 1054   PLT 422 (H) 08/01/2024 1054   MCV 94.9 08/01/2024 1054   MCH 31.4 08/01/2024 1054   MCHC 33.1 08/01/2024 1054   RDW 15.9 (H) 08/01/2024 1054   LYMPHSABS 1.3 08/01/2024 1054   MONOABS 0.8 08/01/2024 1054   EOSABS 0.3 08/01/2024 1054   BASOSABS 0.1 08/01/2024 1054    CMP     Component Value Date/Time   NA 140 08/01/2024 1054   K 3.9 08/01/2024 1054   CL 107 08/01/2024 1054   CO2 24 08/01/2024 1054   GLUCOSE 188 (H) 08/01/2024 1054   BUN 12 08/01/2024 1054   CREATININE 0.64 08/01/2024 1054   CALCIUM 8.9 08/01/2024 1054   PROT 6.5 08/01/2024 1054   ALBUMIN 3.9 08/01/2024 1054   AST 16 08/01/2024 1054   ALT 8 08/01/2024 1054   ALKPHOS 67 08/01/2024 1054   BILITOT 0.2 08/01/2024 1054   GFRNONAA >60 08/01/2024 1054     ASSESSMENT and  THERAPY PLAN:   No problem-specific Assessment & Plan notes found for this encounter.   Assessment and Plan Assessment & Plan       All questions were answered. The patient knows to call the clinic with any problems, questions or concerns. We can certainly see the patient much sooner if necessary.  Total encounter time:*** minutes*in face-to-face visit time, chart review, lab review, care coordination, order entry, and documentation of the encounter time.    Christy Kendall, NP 08/01/2024 12:01 PM Medical Oncology and Hematology [facility] [address] [place], [place] [address] Tel. [phone]    Fax. [phone]  *Total Encounter Time as defined by the Centers for Medicare and Medicaid Services includes, in addition to the face-to-face time of a patient visit (documented in the note above) non-face-to-face time: obtaining and reviewing outside history, ordering and reviewing medications, tests or procedures, care coordination (communications with other health care professionals or caregivers) and documentation in the medical record.

## 2024-08-01 NOTE — Patient Instructions (Signed)
 [facility] - A DEPT OF [facility]  Discharge Instructions: Thank you for choosing [facility] to provide your oncology and hematology care.   If you have a lab appointment with the Cancer Center, please go directly to the Cancer Center and check in at the registration area.   Wear comfortable clothing and clothing appropriate for easy access to any Portacath or PICC line.   We strive to give you quality time with your provider. You may need to reschedule your appointment if you arrive late (15 or more minutes).  Arriving late affects you and other patients whose appointments are after yours.  Also, if you miss three or more appointments without notifying the office, you may be dismissed from the clinic at the provider's discretion.      For prescription refill requests, have your pharmacy contact our office and allow 72 hours for refills to be completed.    Today you received the following chemotherapy and/or immunotherapy agents: Pembrolizumab  (keytruda ) Doxorubicin , Cytoxan  (cyclophasphomide)   To help prevent nausea and vomiting after your treatment, we encourage you to take your nausea medication as directed.  BELOW ARE SYMPTOMS THAT SHOULD BE REPORTED IMMEDIATELY: *FEVER GREATER THAN 100.4 F (38 C) OR HIGHER *CHILLS OR SWEATING *NAUSEA AND VOMITING THAT IS NOT CONTROLLED WITH YOUR NAUSEA MEDICATION *UNUSUAL SHORTNESS OF BREATH *UNUSUAL BRUISING OR BLEEDING *URINARY PROBLEMS (pain or burning when urinating, or frequent urination) *BOWEL PROBLEMS (unusual diarrhea, constipation, pain near the anus) TENDERNESS IN MOUTH AND THROAT WITH OR WITHOUT PRESENCE OF ULCERS (sore throat, sores in mouth, or a toothache) UNUSUAL RASH, SWELLING OR PAIN  UNUSUAL VAGINAL DISCHARGE OR ITCHING   Items with * indicate a potential emergency and should be followed up as soon as possible or go to the Emergency Department if any problems should  occur.  Please show the CHEMOTHERAPY ALERT CARD or IMMUNOTHERAPY ALERT CARD at check-in to the Emergency Department and triage nurse.  Should you have questions after your visit or need to cancel or reschedule your appointment, please contact [facility] - [facility]PT [facility]  Dept: [phone]  and follow the prompts.  Office hours are 8:00 a.m. to 4:30 p.m. Monday - Friday. Please note that voicemails left after 4:00 p.m. may not be returned until the following business day.  We are closed weekends and major holidays. You have access to a nurse at all times for urgent questions. Please call the main number to the clinic Dept: [phone] and follow the prompts.   For any non-urgent questions, you may also contact your provider using MyChart. We now offer e-Visits for anyone 87 and older to request care online for non-urgent symptoms. For details visit [email or web address].   Also download the MyChart app! Go to the app store, search MyChart, open the app, select [place], and log in with your MyChart username and password.

## 2024-08-02 LAB — T4: T4, Total: 6.5 ug/dL (ref 4.5–12.0)

## 2024-08-04 ENCOUNTER — Encounter: Payer: Self-pay | Admitting: Hematology and Oncology

## 2024-08-04 ENCOUNTER — Inpatient Hospital Stay: Attending: Hematology and Oncology

## 2024-08-04 VITALS — BP 131/76 | HR 72 | Resp 17

## 2024-08-04 DIAGNOSIS — Z79899 Other long term (current) drug therapy: Secondary | ICD-10-CM | POA: Insufficient documentation

## 2024-08-04 DIAGNOSIS — Z5111 Encounter for antineoplastic chemotherapy: Secondary | ICD-10-CM | POA: Diagnosis present

## 2024-08-04 DIAGNOSIS — Z87891 Personal history of nicotine dependence: Secondary | ICD-10-CM | POA: Insufficient documentation

## 2024-08-04 DIAGNOSIS — Z171 Estrogen receptor negative status [ER-]: Secondary | ICD-10-CM | POA: Insufficient documentation

## 2024-08-04 DIAGNOSIS — D0511 Intraductal carcinoma in situ of right breast: Secondary | ICD-10-CM | POA: Insufficient documentation

## 2024-08-04 MED ORDER — PEGFILGRASTIM-JMDB 6 MG/0.6ML ~~LOC~~ SOSY
6.0000 mg | PREFILLED_SYRINGE | Freq: Once | SUBCUTANEOUS | Status: AC
  Administered 2024-08-04: 6 mg via SUBCUTANEOUS
  Filled 2024-08-04: qty 0.6

## 2024-08-10 ENCOUNTER — Encounter: Payer: Self-pay | Admitting: *Deleted

## 2024-08-22 ENCOUNTER — Telehealth: Payer: Self-pay | Admitting: *Deleted

## 2024-08-22 ENCOUNTER — Inpatient Hospital Stay: Admitting: Adult Health

## 2024-08-22 ENCOUNTER — Encounter: Payer: Self-pay | Admitting: *Deleted

## 2024-08-22 ENCOUNTER — Inpatient Hospital Stay

## 2024-08-22 ENCOUNTER — Other Ambulatory Visit: Payer: Self-pay | Admitting: Hematology and Oncology

## 2024-08-22 ENCOUNTER — Inpatient Hospital Stay (HOSPITAL_BASED_OUTPATIENT_CLINIC_OR_DEPARTMENT_OTHER)

## 2024-08-22 ENCOUNTER — Encounter: Payer: Self-pay | Admitting: Adult Health

## 2024-08-22 VITALS — BP 129/77 | HR 92 | Temp 98.2°F | Resp 16

## 2024-08-22 VITALS — BP 116/64 | HR 84 | Temp 97.5°F | Resp 16 | Wt 146.0 lb

## 2024-08-22 DIAGNOSIS — C50811 Malignant neoplasm of overlapping sites of right female breast: Secondary | ICD-10-CM | POA: Diagnosis not present

## 2024-08-22 DIAGNOSIS — Z171 Estrogen receptor negative status [ER-]: Secondary | ICD-10-CM

## 2024-08-22 DIAGNOSIS — Z5111 Encounter for antineoplastic chemotherapy: Secondary | ICD-10-CM | POA: Diagnosis not present

## 2024-08-22 LAB — CMP (CANCER CENTER ONLY)
ALT: 9 U/L (ref 0–44)
AST: 13 U/L — ABNORMAL LOW (ref 15–41)
Albumin: 4 g/dL (ref 3.5–5.0)
Alkaline Phosphatase: 70 U/L (ref 38–126)
Anion gap: 10 (ref 5–15)
BUN: 13 mg/dL (ref 8–23)
CO2: 23 mmol/L (ref 22–32)
Calcium: 8.8 mg/dL — ABNORMAL LOW (ref 8.9–10.3)
Chloride: 105 mmol/L (ref 98–111)
Creatinine: 0.67 mg/dL (ref 0.44–1.00)
GFR, Estimated: >60 mL/min
Glucose, Bld: 166 mg/dL — ABNORMAL HIGH (ref 70–99)
Potassium: 4.1 mmol/L (ref 3.5–5.1)
Sodium: 138 mmol/L (ref 135–145)
Total Bilirubin: 0.2 mg/dL (ref 0.0–1.2)
Total Protein: 6.7 g/dL (ref 6.5–8.1)

## 2024-08-22 LAB — CBC WITH DIFFERENTIAL (CANCER CENTER ONLY)
Abs Immature Granulocytes: 0.02 K/uL (ref 0.00–0.07)
Basophils Absolute: 0.1 K/uL (ref 0.0–0.1)
Basophils Relative: 1 %
Eosinophils Absolute: 0.2 K/uL (ref 0.0–0.5)
Eosinophils Relative: 4 %
HCT: 31.6 % — ABNORMAL LOW (ref 36.0–46.0)
Hemoglobin: 10.4 g/dL — ABNORMAL LOW (ref 12.0–15.0)
Immature Granulocytes: 0 %
Lymphocytes Relative: 14 %
Lymphs Abs: 0.8 K/uL (ref 0.7–4.0)
MCH: 31.1 pg (ref 26.0–34.0)
MCHC: 32.9 g/dL (ref 30.0–36.0)
MCV: 94.6 fL (ref 80.0–100.0)
Monocytes Absolute: 0.8 K/uL (ref 0.1–1.0)
Monocytes Relative: 13 %
Neutro Abs: 4.2 K/uL (ref 1.7–7.7)
Neutrophils Relative %: 68 %
Platelet Count: 396 K/uL (ref 150–400)
RBC: 3.34 MIL/uL — ABNORMAL LOW (ref 3.87–5.11)
RDW: 15 % (ref 11.5–15.5)
WBC Count: 6.1 K/uL (ref 4.0–10.5)
nRBC: 0 % (ref 0.0–0.2)

## 2024-08-22 LAB — T4, FREE: Free T4: 1.06 ng/dL (ref 0.80–2.00)

## 2024-08-22 LAB — TSH: TSH: 0.569 u[IU]/mL (ref 0.350–4.500)

## 2024-08-22 MED ORDER — SODIUM CHLORIDE 0.9 % IV SOLN: INTRAVENOUS | Status: DC

## 2024-08-22 MED ORDER — SODIUM CHLORIDE 0.9% FLUSH: 10.0000 mL | INTRAVENOUS | Status: DC | PRN

## 2024-08-22 MED ORDER — DOXORUBICIN HCL CHEMO IV INJECTION 2 MG/ML
60.0000 mg/m2 | Freq: Once | INTRAVENOUS | Status: AC
  Administered 2024-08-22: 104 mg via INTRAVENOUS
  Filled 2024-08-22: qty 52

## 2024-08-22 MED ORDER — SODIUM CHLORIDE 0.9 % IV SOLN
200.0000 mg | Freq: Once | INTRAVENOUS | Status: AC
  Administered 2024-08-22: 200 mg via INTRAVENOUS
  Filled 2024-08-22: qty 200

## 2024-08-22 MED ORDER — APREPITANT 130 MG/18ML IV EMUL
130.0000 mg | Freq: Once | INTRAVENOUS | Status: AC
  Administered 2024-08-22: 130 mg via INTRAVENOUS
  Filled 2024-08-22: qty 18

## 2024-08-22 MED ORDER — DEXAMETHASONE SOD PHOSPHATE PF 10 MG/ML IJ SOLN
10.0000 mg | Freq: Once | INTRAMUSCULAR | Status: AC
  Administered 2024-08-22: 10 mg via INTRAVENOUS
  Filled 2024-08-22: qty 1

## 2024-08-22 MED ORDER — PALONOSETRON HCL INJECTION 0.25 MG/5ML
0.2500 mg | Freq: Once | INTRAVENOUS | Status: AC
  Administered 2024-08-22: 0.25 mg via INTRAVENOUS
  Filled 2024-08-22: qty 5

## 2024-08-22 MED ORDER — SODIUM CHLORIDE 0.9 % IV SOLN
600.0000 mg/m2 | Freq: Once | INTRAVENOUS | Status: AC
  Administered 2024-08-22: 1000 mg via INTRAVENOUS
  Filled 2024-08-22: qty 50

## 2024-08-22 NOTE — Progress Notes (Unsigned)
 [facility] Cancer Follow up:    Christy Francisco, MD [address] [place] [address]   DIAGNOSIS: Cancer Staging  Malignant neoplasm of overlapping sites of right breast in female, estrogen receptor negative (HCC) Staging form: Breast, AJCC 8th Edition - Clinical: Stage IIB (cT1c, cN1, cM0, G3, ER-, PR-, HER2-) - Signed by Odean Potts, MD on 04/05/2024 Stage prefix: Initial diagnosis Histologic grading system: 3 grade system    SUMMARY OF ONCOLOGIC HISTORY: Oncology History  Malignant neoplasm of overlapping sites of right breast in female, estrogen receptor negative (HCC)  03/28/2024 Initial Diagnosis   Screening mammogram detected right breast asymmetry, 2 lesions 1.9 cm at 10:00 and 0.7 cm at the 11:00: Biopsy: 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  The report date is April 19, 2024.  The CancerNext-Expanded gene panel offered by [facility] and includes sequencing, rearrangement, and RNA analysis for the following 77 genes: AIP, ALK, APC, ATM, BAP1, BARD1, BMPR1A, BRCA1, BRCA2, BRIP1, CDC73, CDH1, CDK4, CDKN1B, CDKN2A, CEBPA, CHEK2, CTNNA1, DDX41, DICER1, ETV6, FH, FLCN, GATA2, LZTR1, MAX, MBD4, MEN1, MET, MLH1, MSH2, MSH3, MSH6, MUTYH, NF1, NF2, NTHL1, PALB2, PHOX2B, PMS2, POT1, PRKAR1A, PTCH1, PTEN, RAD51C, RAD51D, RB1, RET, RPS20, RUNX1, SDHA, SDHAF2, SDHB, SDHC, SDHD, SMAD4, SMARCA4,  SMARCB1, SMARCE1, STK11, SUFU, TMEM127, TP53, TSC1, TSC2, VHL, and WT1 (sequencing and deletion/duplication); AXIN2, CTNNA1, DDX41, EGFR, HOXB13, KIT, MBD4, MITF, MSH3, PDGFRA, POLD1 and POLE (sequencing only); EPCAM and GREM1 (deletion/duplication only). RNA data is routinely analyzed for use in variant interpretation for all genes.      CURRENT THERAPY:  INTERVAL HISTORY:  Discussed the use of AI scribe software for clinical note transcription with the patient, who gave verbal consent to proceed.  History of Present Illness      Patient Active Problem List   Diagnosis Date Noted   Genetic testing 04/24/2024   Family history of prostate cancer    Family history of ovarian cancer    Malignant neoplasm of overlapping sites of right breast in female, estrogen receptor negative (HCC) 04/04/2024    is allergic to penicillin g.  MEDICAL HISTORY: Past Medical History:  Diagnosis Date   Anemia    Arthritis    Blood transfusion without reported diagnosis    Cancer (HCC)    Diabetes (HCC)    Family history of ovarian cancer    Family history of prostate cancer    Fibroid    Vaginal Pap smear, abnormal     SURGICAL HISTORY: Past Surgical History:  Procedure Laterality Date   ABDOMINAL HYSTERECTOMY     fibroids, left adnexa   BREAST BIOPSY Right 03/28/2024   US  RT BREAST BX W LOC DEV 1ST LESION IMG BX SPEC US  GUIDE 03/28/2024 [facility]   BREAST BIOPSY Right 03/28/2024   US  RT BREAST BX W LOC DEV EA ADD LESION IMG BX SPEC US  GUIDE 03/28/2024 [facility]  PORTACATH PLACEMENT Left 04/14/2024   Procedure: INSERTION, TUNNELED CENTRAL VENOUS DEVICE, WITH PORT;  Surgeon: Curvin Deward MOULD, MD;  Location: [facility];  Service: General;  Laterality: Left;  PORT PLACEMENT WITH ULTRASOUND GUIDANCE    SOCIAL HISTORY: Social History   Socioeconomic History   Marital status: Single    Spouse name: Not on file   Number of children: 0   Years of education:  Not on file   Highest education level: Not on file  Occupational History   Not on file  Tobacco Use   Smoking status: Former    Current packs/day: 0.00    Types: Cigarettes    Quit date: 01/1996    Years since quitting: 28.6   Smokeless tobacco: Never  Vaping Use   Vaping status: Never Used  Substance and Sexual Activity   Alcohol  use: Never   Drug use: Never   Sexual activity: Not Currently  Other Topics Concern   Not on file  Social History Narrative   Not on file   Social Drivers of Health   Tobacco Use: Medium Risk (08/01/2024)   Patient History    Smoking Tobacco Use: Former    Smokeless Tobacco Use: Never    Passive Exposure: Not on Actuary Strain: Low Risk (08/01/2024)   Overall Financial Resource Strain (CARDIA)    Difficulty of Paying Living Expenses: Not hard at all  Food Insecurity: No Food Insecurity (08/01/2024)   Epic    Worried About Programme Researcher, Broadcasting/film/video in the Last Year: Never true    Ran Out of Food in the Last Year: Never true  Transportation Needs: No Transportation Needs (08/01/2024)   Epic    Lack of Transportation (Medical): No    Lack of Transportation (Non-Medical): No  Physical Activity: Not on file  Stress: No Stress Concern Present (08/01/2024)   [employer] of Occupational Health - Occupational Stress Questionnaire    Feeling of Stress: Not at all  Social Connections: Unknown (06/27/2024)   Social Connection and Isolation Panel    Frequency of Communication with Friends and Family: Twice a week    Frequency of Social Gatherings with Friends and Family: Once a week    Attends Religious Services: Not on Insurance Claims Handler of Clubs or Organizations: No    Attends Banker Meetings: Never    Marital Status: Not on file  Intimate Partner Violence: Not At Risk (08/01/2024)   Epic    Fear of Current or Ex-Partner: No    Emotionally Abused: No    Physically Abused: No    Sexually Abused: No  Depression  (PHQ2-9): Low Risk (08/01/2024)   Depression (PHQ2-9)    PHQ-2 Score: 0  Alcohol  Screen: Low Risk (08/01/2024)   Alcohol  Screen    Last Alcohol  Screening Score (AUDIT): 0  Housing: Unknown (08/01/2024)   Epic    Unable to Pay for Housing in the Last Year: No    Number of Times Moved in the Last Year: Not on file    Homeless in the Last Year: No  Utilities: Not At Risk (08/01/2024)   Epic    Threatened with loss of utilities: No  Health Literacy: Adequate Health Literacy (08/01/2024)   B1300 Health Literacy    Frequency of need for help with medical instructions: Never    FAMILY HISTORY: Family History  Problem Relation Age of Onset   Cancer Brother    Prostate cancer Brother  Ovarian cancer Maternal Aunt    Cervical cancer Maternal Aunt    Breast cancer Neg Hx     Review of Systems - Oncology    PHYSICAL EXAMINATION    Vitals:   08/22/24 1319  BP: 116/64  Pulse: 84  Resp: 16  Temp: (!) 97.5 F (36.4 C)  SpO2: 100%    Physical Exam  LABORATORY DATA:  CBC    Component Value Date/Time   WBC 6.1 08/22/2024 1250   RBC 3.34 (L) 08/22/2024 1250   HGB 10.4 (L) 08/22/2024 1250   HCT 31.6 (L) 08/22/2024 1250   PLT 396 08/22/2024 1250   MCV 94.6 08/22/2024 1250   MCH 31.1 08/22/2024 1250   MCHC 32.9 08/22/2024 1250   RDW 15.0 08/22/2024 1250   LYMPHSABS 0.8 08/22/2024 1250   MONOABS 0.8 08/22/2024 1250   EOSABS 0.2 08/22/2024 1250   BASOSABS 0.1 08/22/2024 1250    CMP     Component Value Date/Time   NA 138 08/22/2024 1250   K 4.1 08/22/2024 1250   CL 105 08/22/2024 1250   CO2 23 08/22/2024 1250   GLUCOSE 166 (H) 08/22/2024 1250   BUN 13 08/22/2024 1250   CREATININE 0.67 08/22/2024 1250   CALCIUM 8.8 (L) 08/22/2024 1250   PROT 6.7 08/22/2024 1250   ALBUMIN 4.0 08/22/2024 1250   AST 13 (L) 08/22/2024 1250   ALT 9 08/22/2024 1250   ALKPHOS 70 08/22/2024 1250   BILITOT 0.2 08/22/2024 1250   GFRNONAA >60 08/22/2024 1250     ASSESSMENT and  THERAPY PLAN:   No problem-specific Assessment & Plan notes found for this encounter.   Assessment and Plan Assessment & Plan       All questions were answered. The patient knows to call the clinic with any problems, questions or concerns. We can certainly see the patient much sooner if necessary.  Total encounter time:*** minutes*in face-to-face visit time, chart review, lab review, care coordination, order entry, and documentation of the encounter time.    [name], NP 08/22/24 1:20 PM Medical Oncology and Hematology [facility] [address] [place], [place] [address] Tel. [phone]    Fax. [phone]  *Total Encounter Time as defined by the Centers for Medicare and Medicaid Services includes, in addition to the face-to-face time of a patient visit (documented in the note above) non-face-to-face time: obtaining and reviewing outside history, ordering and reviewing medications, tests or procedures, care coordination (communications with other health care professionals or caregivers) and documentation in the medical record.

## 2024-08-22 NOTE — Telephone Encounter (Signed)
 Spoke with patient while in treatment today regarding MRI and surgeon appt.  Informed her that MRI would be ordered to assess tx response to chemo and would need to see surgeon after to discuss sx.  She is undecided if she wants to see Dr. Curvin again.  I gave her a list of our breast surgeons and told her to let me know if she wants to see someone else so we could get her in.  Gave contact information. Patient verbalized understanding.

## 2024-08-22 NOTE — Patient Instructions (Signed)
 [facility] - A DEPT OF [facility]  Discharge Instructions: Thank you for choosing [facility] to provide your oncology and hematology care.   If you have a lab appointment with the Cancer Center, please go directly to the Cancer Center and check in at the registration area.   Wear comfortable clothing and clothing appropriate for easy access to any Portacath or PICC line.   We strive to give you quality time with your provider. You may need to reschedule your appointment if you arrive late (15 or more minutes).  Arriving late affects you and other patients whose appointments are after yours.  Also, if you miss three or more appointments without notifying the office, you may be dismissed from the clinic at the providers discretion.      For prescription refill requests, have your pharmacy contact our office and allow 72 hours for refills to be completed.    Today you received the following chemotherapy and/or immunotherapy agents Cytoxan , Doxorubicin , Keytruda       To help prevent nausea and vomiting after your treatment, we encourage you to take your nausea medication as directed.  BELOW ARE SYMPTOMS THAT SHOULD BE REPORTED IMMEDIATELY: *FEVER GREATER THAN 100.4 F (38 C) OR HIGHER *CHILLS OR SWEATING *NAUSEA AND VOMITING THAT IS NOT CONTROLLED WITH YOUR NAUSEA MEDICATION *UNUSUAL SHORTNESS OF BREATH *UNUSUAL BRUISING OR BLEEDING *URINARY PROBLEMS (pain or burning when urinating, or frequent urination) *BOWEL PROBLEMS (unusual diarrhea, constipation, pain near the anus) TENDERNESS IN MOUTH AND THROAT WITH OR WITHOUT PRESENCE OF ULCERS (sore throat, sores in mouth, or a toothache) UNUSUAL RASH, SWELLING OR PAIN  UNUSUAL VAGINAL DISCHARGE OR ITCHING   Items with * indicate a potential emergency and should be followed up as soon as possible or go to the Emergency Department if any problems should occur.  Please show the CHEMOTHERAPY ALERT  CARD or IMMUNOTHERAPY ALERT CARD at check-in to the Emergency Department and triage nurse.  Should you have questions after your visit or need to cancel or reschedule your appointment, please contact [facility] - A DEPT OF [facility]  Dept: [phone]  and follow the prompts.  Office hours are 8:00 a.m. to 4:30 p.m. Monday - Friday. Please note that voicemails left after 4:00 p.m. may not be returned until the following business day.  We are closed weekends and major holidays. You have access to a nurse at all times for urgent questions. Please call the main number to the clinic Dept: [phone] and follow the prompts.   For any non-urgent questions, you may also contact your provider using MyChart. We now offer e-Visits for anyone 63 and older to request care online for non-urgent symptoms. For details visit [email or web address].   Also download the MyChart app! Go to the app store, search MyChart, open the app, select [place], and log in with your MyChart username and password.

## 2024-08-24 ENCOUNTER — Inpatient Hospital Stay

## 2024-08-24 ENCOUNTER — Encounter: Payer: Self-pay | Admitting: Hematology and Oncology

## 2024-08-24 VITALS — BP 142/77 | HR 84 | Temp 98.8°F | Resp 17

## 2024-08-24 DIAGNOSIS — Z5111 Encounter for antineoplastic chemotherapy: Secondary | ICD-10-CM | POA: Diagnosis not present

## 2024-08-24 DIAGNOSIS — Z171 Estrogen receptor negative status [ER-]: Secondary | ICD-10-CM

## 2024-08-24 MED ORDER — PEGFILGRASTIM-JMDB 6 MG/0.6ML ~~LOC~~ SOSY
6.0000 mg | PREFILLED_SYRINGE | Freq: Once | SUBCUTANEOUS | Status: AC
  Administered 2024-08-24: 6 mg via SUBCUTANEOUS
  Filled 2024-08-24: qty 0.6

## 2024-09-05 ENCOUNTER — Encounter: Payer: Self-pay | Admitting: *Deleted

## 2024-09-06 ENCOUNTER — Telehealth: Payer: Self-pay | Admitting: *Deleted

## 2024-09-06 NOTE — Telephone Encounter (Signed)
" °  [name], ICE COMPRESS: RANDOMIZED TRIAL OF LIMB CRYOCOMPRESSION VERSUS CONTINUOUS COMPRESSION VERSUS LOW CYCLIC COMPRESSION FOR THE PREVENTION OF TAXANE-INDUCED PERIPHERAL NEUROPATHY       Attempted to reach pt regarding above clinical study. LVM for return call.   [name], RN, BSN Clinical Research Nurse [phone] 09/06/2024   "

## 2024-09-07 ENCOUNTER — Other Ambulatory Visit: Payer: Self-pay

## 2024-09-12 ENCOUNTER — Inpatient Hospital Stay: Admitting: *Deleted

## 2024-09-12 ENCOUNTER — Inpatient Hospital Stay: Attending: Hematology and Oncology

## 2024-09-12 ENCOUNTER — Inpatient Hospital Stay

## 2024-09-12 ENCOUNTER — Inpatient Hospital Stay: Admitting: Adult Health

## 2024-09-14 ENCOUNTER — Inpatient Hospital Stay

## 2024-09-21 ENCOUNTER — Other Ambulatory Visit

## 2024-09-27 ENCOUNTER — Inpatient Hospital Stay: Admitting: Gynecologic Oncology

## 2024-09-28 ENCOUNTER — Other Ambulatory Visit
